# Patient Record
Sex: Female | Born: 1950 | Race: White | Hispanic: No | Marital: Married | Smoking: Current every day smoker
Health system: Southern US, Community
[De-identification: ages and names within clinical notes are randomized; demographics above are authoritative.]

## PROBLEM LIST (undated history)

## (undated) DIAGNOSIS — K5790 Diverticulosis of intestine, part unspecified, without perforation or abscess without bleeding: Secondary | ICD-10-CM

## (undated) HISTORY — DX: Diverticulosis of intestine, part unspecified, without perforation or abscess without bleeding: K57.90

## (undated) HISTORY — PX: OOPHORECTOMY: SHX86

## (undated) HISTORY — PX: BREAST EXCISIONAL BIOPSY: SUR124

---
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
## 1986-04-06 HISTORY — PX: ABDOMINAL HYSTERECTOMY: SHX81

## 2008-12-07 ENCOUNTER — Ambulatory Visit: Payer: Self-pay | Admitting: Family Medicine

## 2010-12-05 ENCOUNTER — Ambulatory Visit: Payer: Self-pay | Admitting: General Surgery

## 2012-04-13 ENCOUNTER — Ambulatory Visit: Payer: Self-pay | Admitting: General Practice

## 2012-06-03 ENCOUNTER — Ambulatory Visit: Payer: Self-pay | Admitting: General Practice

## 2014-07-27 NOTE — Op Note (Signed)
PATIENT NAME:  Julie Pena, BOMAN MR#:  [id number] DATE OF BIRTH:  [birth year]  DATE OF PROCEDURE:  06/03/2012  PREOPERATIVE DIAGNOSIS: Internal derangement of the right knee.   POSTOPERATIVE DIAGNOSES: 1.  Tear of the posterior horn of the medial meniscus, right knee.  2.  Tear of the posterior horn of the lateral meniscus, right knee.  3. 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   PROCEDURES PERFORMED: Right knee arthroscopy, partial medial and lateral meniscectomy, removal of chondral loose body, and chondroplasty of the patellofemoral articulation.   SURGEON: Laurice Record. Hooten, MD  ANESTHESIA: General.   ESTIMATED BLOOD LOSS: Minimal.   TOURNIQUET TIME: Not used.   DRAINS: None.   INDICATIONS FOR SURGERY: The patient is a 64 year old female who has been seen for complaints of persistent right knee pain and swelling. MRI demonstrated findings consistent with meniscal pathology. After discussion of the risks and benefits of surgical intervention, the patient expressed understanding of the risks and benefits and agreed with plans for surgical intervention.   PROCEDURE IN DETAIL: The patient was brought into the operating room and, after adequate general anesthesia was achieved, a tourniquet was placed on the patient's right thigh and the leg was placed in a leg holder. All bony prominences were well padded. The patient's right knee and leg were cleaned and prepped with alcohol and DuraPrep and draped in the usual sterile fashion. A "timeout" was performed as per usual protocol. The anticipated portal sites were injected with 0.25% Marcaine with epinephrine. An anterolateral portal was created and a cannula was inserted. A moderate effusion was evacuated. The scope was inserted and the knee was distended with fluid using the Stryker pump. The scope was advanced down the medial gutter into the medial compartment of the knee. Under visualization  with the scope, an anteromedial portal was created and hook probe was inserted. Inspection of the medial compartment showed the articular surface to be in reasonably good condition with only mild fraying. There was a complex tear of the posterior horn of the medial meniscus with flap-type lesion noted and a horizontal cleavage tear. The area of the tear was debrided using meniscal punches and a 4.5 mm shaver. Final contouring was performed using the 50 degree ArthroCare wand. The remaining rim of meniscus was visualized and probed and felt to stable. The anterior horn of the medial meniscus was visualized and probed and was stable. The scope was advanced into the intercondylar region. The anterior cruciate ligament was visualized and probed and felt to be stable. The scope was removed from the anterolateral portal and reinserted via the anteromedial portal so as to better visualize the lateral compartment. The articular surface was in good condition. There was a small flap-type lesion noted to the posterior horn of the lateral meniscus. This was debrided using meniscal punches and then contoured using the 50 degree ArthroCare wand. The remaining portion of the meniscus was visualized and probed and felt to be stable. There was a chondral loose body which was encountered along the intercondylar notch. This was removed using a pituitary forceps. Inspection of the patellofemoral articulation showed good patellar tracking. There was a chondral lesion with grade III changes noted along the notch and groove. These areas were debrided using the ArthroCare wand. The knee was irrigated with copious amounts of fluid and then suctioned dry. The anterolateral portal was reapproximated using 3-0 nylon. A combination of 0.25% Marcaine with epinephrine and 4 mg of morphine was injected  via the scope. The scope was removed and the anteromedial portal was reapproximated using 3-0 nylon. Sterile dressing was applied followed by  application of an ice wrap.   The patient tolerated the procedure well. She was transported to the recovery room in stable condition.  ____________________________ Laurice Record. Holley Bouche., MD [email or web address] D: 06/03/2012 09:17:39 ET T: 06/03/2012 10:15:03 ET JOB#: [id number]  cc: Jeneen Rinks P. Holley Bouche., MD, <Dictator> Laurice Record Holley Bouche MD ELECTRONICALLY SIGNED 06/04/2012 12:58

## 2016-04-06 HISTORY — PX: CATARACT EXTRACTION: SUR2

## 2017-08-11 ENCOUNTER — Ambulatory Visit (INDEPENDENT_AMBULATORY_CARE_PROVIDER_SITE_OTHER): Payer: Medicare HMO | Admitting: Family Medicine

## 2017-08-11 ENCOUNTER — Encounter: Payer: Self-pay | Admitting: Family Medicine

## 2017-08-11 VITALS — BP 125/64 | HR 78 | Temp 97.8°F | Resp 16 | Ht 66.0 in | Wt 177.0 lb

## 2017-08-11 DIAGNOSIS — Z7689 Persons encountering health services in other specified circumstances: Secondary | ICD-10-CM | POA: Diagnosis not present

## 2017-08-11 DIAGNOSIS — Z8739 Personal history of other diseases of the musculoskeletal system and connective tissue: Secondary | ICD-10-CM

## 2017-08-11 DIAGNOSIS — Z79890 Hormone replacement therapy (postmenopausal): Secondary | ICD-10-CM

## 2017-08-11 DIAGNOSIS — K5790 Diverticulosis of intestine, part unspecified, without perforation or abscess without bleeding: Secondary | ICD-10-CM | POA: Insufficient documentation

## 2017-08-11 DIAGNOSIS — Z1231 Encounter for screening mammogram for malignant neoplasm of breast: Secondary | ICD-10-CM

## 2017-08-11 DIAGNOSIS — K635 Polyp of colon: Secondary | ICD-10-CM | POA: Diagnosis not present

## 2017-08-11 DIAGNOSIS — Z1239 Encounter for other screening for malignant neoplasm of breast: Secondary | ICD-10-CM

## 2017-08-11 NOTE — Patient Instructions (Addendum)
Thank you for coming to the office today.  Most likely some arthritis of low back, this is wear and tear issue, can flare up periodically  Stay active, avoid triggering factors, use proper lifting techniques  May use ice and heat as discussed  Recommend to start taking Tylenol Extra Strength 500mg  tabs - take 1 to 2 tabs per dose (max 1000mg ) every 6-8 hours for pain (take regularly, don't skip a dose for next 7 days), max 24 hour daily dose is 6 tablets or 3000mg . In the future you can repeat the same everyday Tylenol course for 1-2 weeks at a time.  - This is safe to take with anti-inflammatory medicines (Ibuprofen, Advil, Naproxen, Aleve, Meloxicam, Mobic)  For Mammogram screening for breast cancer   [facility] [place], [place] [address] Phone: [phone]  SIGN RELEASE FORM AND TURN IN TO [place]  Call the [facility] below anytime to schedule your own appointment now that order has been placed.  [facility]  Address: [address], [place], [place] [address]  Phone: [phone]  Next visit we will offer Prevnar-13 - pneumonia 1st dose then 1 year later is next dose  I would recommend Shingrix vaccine - 2 doses 2 months apart at pharmacy for medicare coverage - may get some reaction to 2nd dose but overall well tolerated and very effective.   DUE for FASTING BLOOD WORK (no food or drink after midnight before the lab appointment, only water or coffee without cream/sugar on the morning of)  SCHEDULE "Lab Only" visit in the morning at the clinic for lab draw in 1-2 weeks  - Make sure Lab Only appointment is at about 1 week before your next appointment, so that results will be available  For Lab Results, once available within 2-3 days of blood draw, you can can log in to MyChart online to view your results and a brief explanation. Also, we can discuss  results at next follow-up visit.   Please schedule a Follow-up Appointment to: Return in about 2 weeks (around 08/25/2017) for keep apt for physical and labs.  If you have any other questions or concerns, please feel free to call the office or send a message through [place]. You may also schedule an earlier appointment if necessary.  Additionally, you may be receiving a survey about your experience at our office within a few days to 1 week by e-mail or mail. We value your feedback.  Nobie Putnam, DO [place]

## 2017-08-11 NOTE — Progress Notes (Signed)
Subjective:    Patient ID: Julie Pena, female    DOB: 1950-04-12, 67 y.o.   MRN: [id number]  Julie Pena is a 67 y.o. female presenting on 08/11/2017 for Establish Care (back pain)  Previously followed by Benita Stabile MD locally and now she has decided to switch PCP to our office, her husband, Desera Graffeo is also my patient here.  HPI   History of endometriosis / Postmenopausal Estrogen Deficiency S/p partial hysterectomy and then 2nd complete Postmenopausal Estrogen Deficiency She has been on chronic HRT, Premarin, now down to 0.5 one pill every 3 days, trying to wean off in future  History of Back Pain Reports chronic history for years with intermittent mild to moderate flare ups of low back pain, without clear injury or problem, usually improve with rest and heating pad. She does not like to take medicine for it. No prior imaging X-ray, no clear dx of arthritis  Diverticulosis Has history of recurrent episodes of diverculitis - she has occasional flare ups, with either diarrhea and/or constipation, usually starts with deeper pain mid stomach, and nausea, she does not have dark stools or blood associated her prior flare ups. Usually improved with Cipro antibiotic, and occasionally requires additional Flagyl x 1 episode was more severe, usually likes to have it treated early onset.  PMH - as child she had issue of required bladder dilation, now she has had some recurrent episodes of UTI, has inc freq and dysuria, none recently but has episodic spells  Recent stomach virus week ago, sick contacts with husband and grandson at home. She has improved now.  Lifestyle - Diet: mostly balanced - Exercise: Limited. Gym member, daughter working full time   Health Maintenance:  Cervical CA Screening: S/p initial partial hysterectomy then total hysterectomy including removal of cervix. She had pap smear still completed by Dr Luan Pulling, reportedly normal. No prior abnormal screening, now  age >74 without cervix  Breast CA Screening: Due for mammogram screening. Last mammogram result 1 year ago reported negative, 09/07/16 in [place] bi-rads 2, done at [facility]. No prior history abnormal mammogram. No known family history of breast cancer. Currently asymptomatic. - She requests clinical breast exam yearly, she does not often do self exam  Colon CA Screening: Last Colonoscopy (done approx 3-5 years ago by uncertain but done local in [place]), results with multiple benign polyps, good for 10 years per her, do not have copy of report. Currently asymptomatic. No known family history of colon CA. Awaiting record and result to scan. Next due colonoscopy approx 2025-2026  Never had pneumonia - will plan on prevnar-13 1st dose age 39, then year later pneumovax-11  Shingles - never had shingles vaccine, she had new granddaughter at home, now requesting more information on this vaccine interested in Shingrix   Depression screen [facility] 2/9 08/11/2017  Decreased Interest 0  Down, Depressed, Hopeless 0  PHQ - 2 Score 0    Past Medical History:  Diagnosis Date  . Diverticulosis    Past Surgical History:  Procedure Laterality Date  . ABDOMINAL HYSTERECTOMY  1988   Initial partial 1978, then repeat TOTAL 1988  . CATARACT EXTRACTION Bilateral 2018  . TONSILLECTOMY Bilateral 1963   Social History   Socioeconomic History  . Marital status: Married    Spouse name: Mykal Kirchman  . Number of children: Not on file  . Years of education: Not on file  . Highest education level: Not on file  Occupational History  .  Not on file  Social Needs  . Financial resource strain: Not on file  . Food insecurity:    Worry: Not on file    Inability: Not on file  . Transportation needs:    Medical: Not on file    Non-medical: Not on file  Tobacco Use  . Smoking status: Current Every Day Smoker    Packs/day: 0.50    Years: 30.00    Pack years: 15.00    Types: Cigarettes  .  Smokeless tobacco: Current User  Substance and Sexual Activity  . Alcohol use: Never    Frequency: Never  . Drug use: Never  . Sexual activity: Not on file  Lifestyle  . Physical activity:    Days per week: Not on file    Minutes per session: Not on file  . Stress: Not on file  Relationships  . Social connections:    Talks on phone: Not on file    Gets together: Not on file    Attends religious service: Not on file    Active member of club or organization: Not on file    Attends meetings of clubs or organizations: Not on file    Relationship status: Not on file  . Intimate partner violence:    Fear of current or ex partner: Not on file    Emotionally abused: Not on file    Physically abused: Not on file    Forced sexual activity: Not on file  Other Topics Concern  . Not on file  Social History Narrative  . Not on file   Family History  Problem Relation Age of Onset  . Cancer Mother        kidney  . Kidney cancer Mother   . Cancer Father        lung  . Alcohol abuse Father   . Depression Father   . Lung cancer Father   . Colon cancer Neg Hx   . Breast cancer Neg Hx    Current Outpatient Medications on File Prior to Visit  Medication Sig  . estradiol (ESTRACE) 0.5 MG tablet Take 0.5 mg by mouth daily.   . fexofenadine-pseudoephedrine (ALLEGRA-D 24) 180-240 MG 24 hr tablet Take 1 tablet by mouth daily.  . Probiotic Product (ALIGN PO) Take by mouth. Take probiotic daily   No current facility-administered medications on file prior to visit.     Review of Systems  Constitutional: Negative for activity change, appetite change, chills, diaphoresis, fatigue and fever.  HENT: Negative for congestion and hearing loss.   Eyes: Negative for visual disturbance.  Respiratory: Negative for apnea, cough, choking, chest tightness, shortness of breath and wheezing.   Cardiovascular: Negative for chest pain, palpitations and leg swelling.  Gastrointestinal: Negative for abdominal  pain, anal bleeding, blood in stool, constipation, diarrhea, nausea and vomiting.  Endocrine: Negative for cold intolerance.  Genitourinary: Negative for decreased urine volume, difficulty urinating, dysuria, frequency, hematuria, pelvic pain, urgency, vaginal bleeding, vaginal discharge and vaginal pain.  Musculoskeletal: Negative for arthralgias, back pain (none actively, history of recurrent episodes, mild back pain) and neck pain.  Skin: Negative for rash.  Allergic/Immunologic: Negative for environmental allergies.  Neurological: Negative for dizziness, weakness, light-headedness, numbness and headaches.  Hematological: Negative for adenopathy.  Psychiatric/Behavioral: Negative for behavioral problems, dysphoric mood and sleep disturbance. The patient is not nervous/anxious.    Per HPI unless specifically indicated above     Objective:    BP 125/64   Pulse 78   Temp 97.8  F (36.6 C) (Oral)   Resp 16   Ht 5\' 6"  (1.676 m)   Wt 177 lb (80.3 kg)   SpO2 99%   BMI 28.57 kg/m   Wt Readings from Last 3 Encounters:  08/11/17 177 lb (80.3 kg)    Physical Exam  Constitutional: She is oriented to person, place, and time. She appears well-developed and well-nourished. No distress.  Well-appearing, comfortable, cooperative  HENT:  Head: Normocephalic and atraumatic.  Mouth/Throat: Oropharynx is clear and moist.  Eyes: Conjunctivae are normal. Right eye exhibits no discharge. Left eye exhibits no discharge.  Cardiovascular: Normal rate.  Pulmonary/Chest: Effort normal.  Musculoskeletal: She exhibits no edema.  Neurological: She is alert and oriented to person, place, and time.  Skin: Skin is warm and dry. No rash noted. She is not diaphoretic. No erythema.  Psychiatric: She has a normal mood and affect. Her behavior is normal.  Well groomed, good eye contact, normal speech and thoughts  Nursing note and vitals reviewed.  No results found for this or any previous visit.      Assessment & Plan:   Problem List Items Addressed This Visit    Colon polyps    On last colonoscopy by her report Request record and report for review to be abstracted Anticipate next due in approx 5-7 years      Diverticulosis - Primary    Stable without flare up Prior chronic history episodic recurrent flares, improve typically with Cipro +/- Flagyl Follow-up PRN in future, given time sensitivity of issues likely will send rx Cipro to pharmacy at initial onset if mild moderate and both antibiotics if more severe, and arrange close follow-up, may need 2nd opinion GI if too frequent flares      History of back pain    Stable without flare 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 without significant problem or complication Improve w/ conservative therapy as needed for flares, not taking regular NSAID or Tylenol Follow-up in future PRN - may need update imaging, Lumbar spine X-ray      Hormone replacement therapy (postmenopausal)    Stable, currently still on chronic HRT, lower intermittent dose now Follow-up in future as planned, refill as needed and continue to wean off gradually       Other Visit Diagnoses    Screening for breast cancer       Due for yearly mammogram, no prior abnormal, previously at [facility] > release record form signed, to be faxed to [facility], ordered Mammo   Relevant Orders   MM [place]   Encounter to establish care with new doctor       Records requested from Dr Hall Busing previous PCP for review      No orders of the defined types were placed in this encounter.   Follow up plan: Return in about 2 weeks (around 08/25/2017) for keep apt for physical and labs.  Future labs ordered 08/18/17 - will place orders and adjust according to last lab results from outside PCP once record is reviewed  Nobie Putnam, [facility] 08/12/2017, 12:06 AM

## 2017-08-12 ENCOUNTER — Other Ambulatory Visit: Payer: Self-pay | Admitting: Family Medicine

## 2017-08-12 DIAGNOSIS — K5790 Diverticulosis of intestine, part unspecified, without perforation or abscess without bleeding: Secondary | ICD-10-CM

## 2017-08-12 DIAGNOSIS — Z0000 Encounter for general adult medical examination without abnormal findings: Secondary | ICD-10-CM

## 2017-08-12 DIAGNOSIS — Z1159 Encounter for screening for other viral diseases: Secondary | ICD-10-CM

## 2017-08-12 DIAGNOSIS — Z79899 Other long term (current) drug therapy: Secondary | ICD-10-CM

## 2017-08-12 DIAGNOSIS — Z79890 Hormone replacement therapy (postmenopausal): Secondary | ICD-10-CM

## 2017-08-12 NOTE — Assessment & Plan Note (Addendum)
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

## 2017-08-12 NOTE — Assessment & Plan Note (Addendum)
Stable without flare up Prior chronic history episodic recurrent flares, improve typically with Cipro +/- Flagyl Follow-up PRN in future, given time sensitivity of issues likely will send rx Cipro to pharmacy at initial onset if mild moderate and both antibiotics if more severe, and arrange close follow-up, may need 2nd opinion GI if too frequent flares

## 2017-08-12 NOTE — Assessment & Plan Note (Signed)
Stable, currently still on chronic HRT, lower intermittent dose now Follow-up in future as planned, refill as needed and continue to wean off gradually

## 2017-08-12 NOTE — Assessment & Plan Note (Signed)
On last colonoscopy by her report Request record and report for review to be abstracted Anticipate next due in approx 5-7 years

## 2017-08-18 ENCOUNTER — Other Ambulatory Visit: Payer: Medicare HMO

## 2017-08-18 DIAGNOSIS — Z0000 Encounter for general adult medical examination without abnormal findings: Secondary | ICD-10-CM | POA: Diagnosis not present

## 2017-08-18 DIAGNOSIS — K5790 Diverticulosis of intestine, part unspecified, without perforation or abscess without bleeding: Secondary | ICD-10-CM

## 2017-08-18 DIAGNOSIS — Z1159 Encounter for screening for other viral diseases: Secondary | ICD-10-CM

## 2017-08-18 DIAGNOSIS — Z79899 Other long term (current) drug therapy: Secondary | ICD-10-CM | POA: Diagnosis not present

## 2017-08-18 DIAGNOSIS — Z79890 Hormone replacement therapy (postmenopausal): Secondary | ICD-10-CM

## 2017-08-19 LAB — CBC WITH DIFFERENTIAL/PLATELET
Basophils Absolute: 31 {cells}/uL (ref 0–200)
Basophils Relative: 0.6 %
Eosinophils Absolute: 88 cells/uL (ref 15–500)
Eosinophils Relative: 1.7 %
HCT: 37.8 % (ref 35.0–45.0)
Hemoglobin: 13.2 g/dL (ref 11.7–15.5)
Lymphs Abs: 1378 {cells}/uL (ref 850–3900)
MCH: 31 pg (ref 27.0–33.0)
MCHC: 34.9 g/dL (ref 32.0–36.0)
MCV: 88.7 fL (ref 80.0–100.0)
MPV: 10 fL (ref 7.5–12.5)
Monocytes Relative: 5.4 %
Neutro Abs: 3422 cells/uL (ref 1500–7800)
Neutrophils Relative %: 65.8 %
Platelets: 175 10*3/uL (ref 140–400)
RBC: 4.26 10*6/uL (ref 3.80–5.10)
RDW: 13.1 % (ref 11.0–15.0)
Total Lymphocyte: 26.5 %
WBC mixed population: 281 cells/uL (ref 200–950)
WBC: 5.2 10*3/uL (ref 3.8–10.8)

## 2017-08-19 LAB — COMPLETE METABOLIC PANEL WITH GFR
AG Ratio: 2 (calc) (ref 1.0–2.5)
ALT: 14 U/L (ref 6–29)
AST: 15 U/L (ref 10–35)
Albumin: 4.2 g/dL (ref 3.6–5.1)
Alkaline phosphatase (APISO): 73 U/L (ref 33–130)
BUN: 15 mg/dL (ref 7–25)
CO2: 30 mmol/L (ref 20–32)
Calcium: 9.2 mg/dL (ref 8.6–10.4)
Chloride: 107 mmol/L (ref 98–110)
Creat: 0.83 mg/dL (ref 0.50–0.99)
GFR, Est African American: 85 mL/min/{1.73_m2} (ref >=60)
GFR, Est Non African American: 73 mL/min/{1.73_m2} (ref >=60)
Globulin: 2.1 g/dL (calc) (ref 1.9–3.7)
Glucose, Bld: 94 mg/dL (ref 65–99)
Potassium: 4.3 mmol/L (ref 3.5–5.3)
Sodium: 141 mmol/L (ref 135–146)
Total Bilirubin: 0.5 mg/dL (ref 0.2–1.2)
Total Protein: 6.3 g/dL (ref 6.1–8.1)

## 2017-08-19 LAB — HEPATITIS C ANTIBODY
Hepatitis C Ab: NONREACTIVE
SIGNAL TO CUT-OFF: 0.02 (ref <1.00)

## 2017-08-19 LAB — LIPID PANEL
Cholesterol: 182 mg/dL (ref <200)
HDL: 45 mg/dL — ABNORMAL LOW (ref >50)
LDL Cholesterol (Calc): 108 mg/dL (calc) — ABNORMAL HIGH
Non-HDL Cholesterol (Calc): 137 mg/dL — ABNORMAL HIGH (ref <130)
Total CHOL/HDL Ratio: 4 (calc) (ref <5.0)
Triglycerides: 169 mg/dL — ABNORMAL HIGH (ref <150)

## 2017-08-19 LAB — TSH: TSH: 2.14 m[IU]/L (ref 0.40–4.50)

## 2017-08-19 LAB — HEMOGLOBIN A1C
Hgb A1c MFr Bld: 5.4 %{Hb} (ref <5.7)
Mean Plasma Glucose: 108 (calc)
eAG (mmol/L): 6 (calc)

## 2017-08-25 ENCOUNTER — Encounter: Payer: Self-pay | Admitting: Family Medicine

## 2017-08-25 ENCOUNTER — Other Ambulatory Visit: Payer: Self-pay

## 2017-08-25 ENCOUNTER — Ambulatory Visit (INDEPENDENT_AMBULATORY_CARE_PROVIDER_SITE_OTHER): Payer: Medicare HMO | Admitting: Family Medicine

## 2017-08-25 VITALS — BP 135/65 | HR 80 | Temp 98.2°F | Ht 66.0 in | Wt 174.2 lb

## 2017-08-25 DIAGNOSIS — Z79890 Hormone replacement therapy (postmenopausal): Secondary | ICD-10-CM | POA: Diagnosis not present

## 2017-08-25 DIAGNOSIS — Z23 Encounter for immunization: Secondary | ICD-10-CM | POA: Diagnosis not present

## 2017-08-25 DIAGNOSIS — K635 Polyp of colon: Secondary | ICD-10-CM | POA: Diagnosis not present

## 2017-08-25 DIAGNOSIS — Z8739 Personal history of other diseases of the musculoskeletal system and connective tissue: Secondary | ICD-10-CM | POA: Diagnosis not present

## 2017-08-25 DIAGNOSIS — K5790 Diverticulosis of intestine, part unspecified, without perforation or abscess without bleeding: Secondary | ICD-10-CM | POA: Diagnosis not present

## 2017-08-25 DIAGNOSIS — Z0000 Encounter for general adult medical examination without abnormal findings: Secondary | ICD-10-CM | POA: Diagnosis not present

## 2017-08-25 NOTE — Patient Instructions (Addendum)
Thank you for coming to the office today.  Keep mammogram apt - I am not sure about 3d vs other - check with insurance  Will request record for Colonoscopy - and let you know  A1c 5.4 - essentially normal sugar, we will keep track of this yearly  Cholesterol is only mildly abnormal - technically as a smoker may benefit from cholesterol medicine  Tetanus TDap shot today - good for 10 years  Call in advance for Prevnar-13 (1st pneumonia vaccine) - NURSE VISIT when you are ready.  May try Dermabond liquid bandage at pharmacy  Consider DEXA Scan (Bone mineral density) screening for osteoporosis - should be covered  Laceration try to keep covered, wound approximated and will heal   Please schedule a Follow-up Appointment to: Return in about 6 months (around 02/25/2018) for 6 month follow-up.  If you have any other questions or concerns, please feel free to call the office or send a message through [place]. You may also schedule an earlier appointment if necessary.  Additionally, you may be receiving a survey about your experience at our office within a few days to 1 week by e-mail or mail. We value your feedback.  Nobie Putnam, DO [place]

## 2017-08-25 NOTE — Progress Notes (Signed)
Subjective:    Patient ID: Julie Pena, female    DOB: Feb 25, 1951, 67 y.o.   MRN: [id number]  Julie Pena is a 67 y.o. female presenting on 08/25/2017 for Annual Exam   HPI   Here for Annual Physical and Lab Review.  History of endometriosis / Postmenopausal Estrogen Deficiency S/p partial hysterectomy and then 2nd complete Postmenopausal Estrogen Deficiency She has been on chronic HRT, Premarin, now down to 0.5 one pill every 3 days, trying to wean off in future she gets a mild headache on day 3 if doesn't take med  History of Back Pain Reports chronic history for years with intermittent mild to moderate flare ups of low back pain, without clear injury or problem, usually improve with rest and heating pad. She does not like to take medicine for it. No prior imaging X-ray, no clear dx of arthritis  Diverticulosis Has history of recurrent episodes of diverculitis - she has occasional flare ups, with either diarrhea and/or constipation, usually starts with deeper pain mid stomach, and nausea, she does not have dark stools or blood associated her prior flare ups. Usually improved with Cipro antibiotic, and occasionally requires additional Flagyl x 1 episode was more severe, usually likes to have it treated early onset.  History of recurrent UTI Currently asymptomatic. History previuosly of recurrent UTI, req bladder dilatation as child, history of dysuria and urinary freq as symptoms, usually requires antibiotic in past Cipro, Ceftin or other, uses OTC AZO temporarily before   Lifestyle - Diet: mostly balanced - Exercise: Limited. Gym member, daughter working full time   Health Maintenance:  Cervical CA Screening: S/p initial partial hysterectomy then total hysterectomy including removal of cervix. She had pap smear still completed by Dr Luan Pulling, reportedly normal. No prior abnormal screening, now age >72 without cervix  Breast CA Screening: Due for mammogram screening.  Last mammogram result 1 year ago reported negative, 09/07/16 in [place] bi-rads 2, done at [facility]. No prior history abnormal mammogram. No known family history of breast cancer. Currently asymptomatic. - She requests clinical breast exam but then declines today based on upcoming mammogram, she does not often do self exam Scheduled Mammogram, 09/14/17 at [facility]  Colon CA Screening: Last Colonoscopy (done approx 3-5 years ago by uncertain but done local in [place]), results with multiple benign polyps, good for 10 years per her, do not have copy of report. Currently asymptomatic. No known family history of colon CA. Awaiting record and result to scan. Next due colonoscopy approx 2022 based on outside PCP records reviewed  Due for initial pneumonia vaccine at age >69 - she deferred Prevnar-13 and will return for nurse only visit for this vaccine then next year for pneumovax-23  Shingles - never had shingles vaccine, she had new granddaughter at home, now requesting more information on this vaccine interested in Shingrix  Due for TDap, in setting of laceration of her Left ring finger, she had injury in kitchen with scissors, has some bleeding, but doing well.   Depression screen PHQ 2/9 08/11/2017  Decreased Interest 0  Down, Depressed, Hopeless 0  PHQ - 2 Score 0    Past Medical History:  Diagnosis Date  . Diverticulosis    Past Surgical History:  Procedure Laterality Date  . ABDOMINAL HYSTERECTOMY  1988   Initial partial 1978, then repeat TOTAL 1988  . CATARACT EXTRACTION Bilateral 2018  . TONSILLECTOMY Bilateral 1963   Social History   Socioeconomic History  . Marital status:  Married    Spouse name: Ailish Prospero  . Number of children: Not on file  . Years of education: Not on file  . Highest education level: Not on file  Occupational History  . Not on file  Social Needs  . Financial resource strain: Not on file  . Food insecurity:    Worry:  Not on file    Inability: Not on file  . Transportation needs:    Medical: Not on file    Non-medical: Not on file  Tobacco Use  . Smoking status: Current Every Day Smoker    Packs/day: 0.50    Years: 30.00    Pack years: 15.00    Types: Cigarettes  . Smokeless tobacco: Current User  Substance and Sexual Activity  . Alcohol use: Never    Frequency: Never  . Drug use: Never  . Sexual activity: Not on file  Lifestyle  . Physical activity:    Days per week: Not on file    Minutes per session: Not on file  . Stress: Not on file  Relationships  . Social connections:    Talks on phone: Not on file    Gets together: Not on file    Attends religious service: Not on file    Active member of club or organization: Not on file    Attends meetings of clubs or organizations: Not on file    Relationship status: Not on file  . Intimate partner violence:    Fear of current or ex partner: Not on file    Emotionally abused: Not on file    Physically abused: Not on file    Forced sexual activity: Not on file  Other Topics Concern  . Not on file  Social History Narrative  . Not on file   Family History  Problem Relation Age of Onset  . Cancer Mother        kidney  . Kidney cancer Mother   . Cancer Father        lung  . Alcohol abuse Father   . Depression Father   . Lung cancer Father   . Colon cancer Neg Hx   . Breast cancer Neg Hx    Current Outpatient Medications on File Prior to Visit  Medication Sig  . estradiol (ESTRACE) 0.5 MG tablet Take 0.5 mg by mouth daily.   . fexofenadine-pseudoephedrine (ALLEGRA-D 24) 180-240 MG 24 hr tablet Take 1 tablet by mouth daily.  . Probiotic Product (ALIGN PO) Take by mouth. Take probiotic daily   No current facility-administered medications on file prior to visit.     Review of Systems  Constitutional: Negative for activity change, appetite change, chills, diaphoresis, fatigue and fever.  HENT: Negative for congestion and hearing  loss.   Eyes: Negative for visual disturbance.  Respiratory: Negative for apnea, cough, choking, chest tightness, shortness of breath and wheezing.   Cardiovascular: Negative for chest pain, palpitations and leg swelling.  Gastrointestinal: Negative for abdominal pain, anal bleeding, blood in stool, constipation, diarrhea, nausea and vomiting.  Endocrine: Negative for cold intolerance.  Genitourinary: Negative for difficulty urinating, dysuria, frequency, hematuria, vaginal bleeding, vaginal discharge and vaginal pain.  Musculoskeletal: Negative for arthralgias, back pain and neck pain.  Skin: Negative for rash.  Allergic/Immunologic: Negative for environmental allergies.  Neurological: Negative for dizziness, weakness, light-headedness, numbness and headaches.  Hematological: Negative for adenopathy.  Psychiatric/Behavioral: Negative for behavioral problems, dysphoric mood and sleep disturbance. The patient is not nervous/anxious.    Per HPI  unless specifically indicated above     Objective:    BP 135/65 (BP Location: Left Arm, Patient Position: Sitting, Cuff Size: Normal)   Pulse 80   Temp 98.2 F (36.8 C) (Oral)   Ht 5\' 6"  (1.676 m)   Wt 174 lb 3.2 oz (79 kg)   BMI 28.12 kg/m   Wt Readings from Last 3 Encounters:  08/25/17 174 lb 3.2 oz (79 kg)  08/11/17 177 lb (80.3 kg)    Physical Exam  Constitutional: She is oriented to person, place, and time. She appears well-developed and well-nourished. No distress.  Well-appearing, comfortable, cooperative  HENT:  Head: Normocephalic and atraumatic.  Mouth/Throat: Oropharynx is clear and moist.  Frontal / maxillary sinuses non-tender. Nares patent without purulence or edema. Bilateral TMs clear without erythema, effusion or bulging. Oropharynx clear without erythema, exudates, edema or asymmetry.  Eyes: Pupils are equal, round, and reactive to light. Conjunctivae and EOM are normal. Right eye exhibits no discharge. Left eye exhibits  no discharge.  Neck: Normal range of motion. Neck supple. No thyromegaly present.  Cardiovascular: Normal rate, regular rhythm, normal heart sounds and intact distal pulses.  No murmur heard. Pulmonary/Chest: Effort normal and breath sounds normal. No respiratory distress. She has no wheezes. She has no rales.  Abdominal: Soft. Bowel sounds are normal. She exhibits no distension and no mass. There is no tenderness. A hernia (mild palpable lower ventral wall hernia on provoked valsalva, reducible, non tender) is present.  Musculoskeletal: Normal range of motion. She exhibits no edema or tenderness.  Upper / Lower Extremities: - Normal muscle tone, strength bilateral upper extremities 5/5, lower extremities 5/5  Lymphadenopathy:    She has no cervical adenopathy.  Neurological: She is alert and oriented to person, place, and time.  Distal sensation intact to light touch all extremities  Skin: Skin is warm and dry. No rash noted. She is not diaphoretic. No erythema.  Localized subcutaneous mobile mass left shoulder region antero-lateral 2 x 2 cm area, non tender, no erythema  Left ring finger distal pulp of finger with 1 to 1.5 cm linear laceration with some mild bleeding, antibacterial ointment placed, wrapped in gauze and tape to keep intact  Psychiatric: She has a normal mood and affect. Her behavior is normal.  Well groomed, good eye contact, normal speech and thoughts  Nursing note and vitals reviewed.    Results for orders placed or performed in visit on 08/18/17  Hepatitis C antibody  Result Value Ref Range   Hepatitis C Ab NON-REACTIVE NON-REACTI   SIGNAL TO CUT-OFF 0.02 <1.00  TSH  Result Value Ref Range   TSH 2.14 0.40 - 4.50 mIU/L  Lipid panel  Result Value Ref Range   Cholesterol 182 <200 mg/dL   HDL 45 (L) >50 mg/dL   Triglycerides 169 (H) <150 mg/dL   LDL Cholesterol (Calc) 108 (H) mg/dL (calc)   Total CHOL/HDL Ratio 4.0 <5.0 (calc)   Non-HDL Cholesterol (Calc) 137  (H) <130 mg/dL (calc)  COMPLETE METABOLIC PANEL WITH GFR  Result Value Ref Range   Glucose, Bld 94 65 - 99 mg/dL   BUN 15 7 - 25 mg/dL   Creat 0.83 0.50 - 0.99 mg/dL   GFR, Est Non African American 73 > OR = 60 mL/min/1.42m2   GFR, Est African American 85 > OR = 60 mL/min/1.25m2   BUN/Creatinine Ratio NOT APPLICABLE 6 - 22 (calc)   Sodium 141 135 - 146 mmol/L   Potassium 4.3 3.5 - 5.3 mmol/L  Chloride 107 98 - 110 mmol/L   CO2 30 20 - 32 mmol/L   Calcium 9.2 8.6 - 10.4 mg/dL   Total Protein 6.3 6.1 - 8.1 g/dL   Albumin 4.2 3.6 - 5.1 g/dL   Globulin 2.1 1.9 - 3.7 g/dL (calc)   AG Ratio 2.0 1.0 - 2.5 (calc)   Total Bilirubin 0.5 0.2 - 1.2 mg/dL   Alkaline phosphatase (APISO) 73 33 - 130 U/L   AST 15 10 - 35 U/L   ALT 14 6 - 29 U/L  CBC with Differential/Platelet  Result Value Ref Range   WBC 5.2 3.8 - 10.8 Thousand/uL   RBC 4.26 3.80 - 5.10 Million/uL   Hemoglobin 13.2 11.7 - 15.5 g/dL   HCT 37.8 35.0 - 45.0 %   MCV 88.7 80.0 - 100.0 fL   MCH 31.0 27.0 - 33.0 pg   MCHC 34.9 32.0 - 36.0 g/dL   RDW 13.1 11.0 - 15.0 %   Platelets 175 140 - 400 Thousand/uL   MPV 10.0 7.5 - 12.5 fL   Neutro Abs 3,422 1,500 - 7,800 cells/uL   Lymphs Abs 1,378 850 - 3,900 cells/uL   WBC mixed population 281 200 - 950 cells/uL   Eosinophils Absolute 88 15 - 500 cells/uL   Basophils Absolute 31 0 - 200 cells/uL   Neutrophils Relative % 65.8 %   Total Lymphocyte 26.5 %   Monocytes Relative 5.4 %   Eosinophils Relative 1.7 %   Basophils Relative 0.6 %  Hemoglobin A1c  Result Value Ref Range   Hgb A1c MFr Bld 5.4 <5.7 % of total Hgb   Mean Plasma Glucose 108 (calc)   eAG (mmol/L) 6.0 (calc)      Assessment & Plan:   Problem List Items Addressed This Visit    Colon polyps Will contact [facility] and [facility] to determine which office performed last colonoscopy, and request result to have it scanned into system for health maintenance, by PCP outside record it says next due 2022      Diverticulosis Stable without flare See prior A&P    History of back pain   Hormone replacement therapy (postmenopausal) Reviewed gradual taper down on Estradiol now q 3 days consider further spacing out Recommend in future if concerns or still needing long term may refer to GYN for 2nd opinion Reviewed risks    Other Visit Diagnoses    Annual physical exam    -  Primary Updated health maintenance - scheduled mammogram, offered DEXA for routine screening reviewed risk factors and benefits she declines at the moment will reconsider, s/p TDap today due to laceration, deferred Prevnar-13 to a nurse only visit soon - Reviewed lab results - Encourage maintain healthy weight, diet and exercise    Need for diphtheria-tetanus-pertussis (Tdap) vaccine       Relevant Orders   Tdap vaccine greater than or equal to 7yo IM (Completed)      No orders of the defined types were placed in this encounter.   Follow up plan: Return in about 6 months (around 02/25/2018) for 6 month follow-up.  Nobie Putnam, [facility] 08/26/2017, 12:45 AM

## 2017-08-31 ENCOUNTER — Encounter: Payer: Self-pay | Admitting: Family Medicine

## 2017-08-31 ENCOUNTER — Ambulatory Visit (INDEPENDENT_AMBULATORY_CARE_PROVIDER_SITE_OTHER): Payer: Medicare HMO | Admitting: Family Medicine

## 2017-08-31 VITALS — BP 120/70 | HR 78 | Temp 98.7°F | Resp 14 | Ht 66.0 in | Wt 177.6 lb

## 2017-08-31 DIAGNOSIS — R1032 Left lower quadrant pain: Secondary | ICD-10-CM | POA: Diagnosis not present

## 2017-08-31 DIAGNOSIS — K5792 Diverticulitis of intestine, part unspecified, without perforation or abscess without bleeding: Secondary | ICD-10-CM | POA: Diagnosis not present

## 2017-08-31 MED ORDER — CIPROFLOXACIN HCL 500 MG PO TABS: 500.0000 mg | ORAL_TABLET | Freq: Two times a day (BID) | ORAL | 0 refills | Status: DC

## 2017-08-31 NOTE — Progress Notes (Signed)
Subjective:    Patient ID: Julie Pena, female    DOB: 11/02/1950, 67 y.o.   MRN: [id number]  Julie Pena is a 67 y.o. female presenting on 08/31/2017 for Abdominal Pain (for 4 days) and Abdominal Cramping  Patient presents for a same day appointment.  HPI   ACUTE LLQ ABDOMINAL PAIN / PRESUMED DIVERTICULITIS - See past notes for reported prior history of similar diverticulitis flare ups Reports symptoms started over weekend with some twinge and gas pains, and she felt some radiating pain with "knife like stabbing pain" across from lower mid abdomen across to Left side of abdomen. She is taking Advil as needed and using topical heating pad as needed some relief. She can feel a "gas bubble" and "bloating" in this area and it causes some pain. - Previously had more frequent bowel movements as a common symptom BEFORE flare for her - Now last BM was Saturday, has gone 2-3 days without regular bowel movement - She usually gets more constipated bowel movements after flare up - Admits mild nausea, without vomiting - Admits some urinary frequency but this is related to her flare up of diverticulitis similarly in past - Denies any fevers chills sweats, rectal bleeding or blood per stool, hematuria, dysuria   Depression screen [facility] 2/9 08/11/2017  Decreased Interest 0  Down, Depressed, Hopeless 0  PHQ - 2 Score 0    Social History   Tobacco Use  . Smoking status: Current Every Day Smoker    Packs/day: 0.50    Years: 30.00    Pack years: 15.00    Types: Cigarettes  . Smokeless tobacco: Current User  Substance Use Topics  . Alcohol use: Never    Frequency: Never  . Drug use: Never    Review of Systems Per HPI unless specifically indicated above     Objective:    BP 120/70 (BP Location: Right Arm, Patient Position: Sitting, Cuff Size: Normal)   Pulse 78   Temp 98.7 F (37.1 C) (Oral)   Resp 14   Ht 5\' 6"  (1.676 m)   Wt 177 lb 9.6 oz (80.6 kg)   BMI 28.67 kg/m   Wt  Readings from Last 3 Encounters:  08/31/17 177 lb 9.6 oz (80.6 kg)  08/25/17 174 lb 3.2 oz (79 kg)  08/11/17 177 lb (80.3 kg)    Physical Exam  Constitutional: She is oriented to person, place, and time. She appears well-developed and well-nourished. No distress.  Mostly well appearing, some discomfort with abdominal pain, cooperative  HENT:  Head: Normocephalic and atraumatic.  Mouth/Throat: Oropharynx is clear and moist.  Eyes: Conjunctivae are normal. Right eye exhibits no discharge. Left eye exhibits no discharge.  Neck: Normal range of motion. Neck supple. No thyromegaly present.  Cardiovascular: Normal rate, regular rhythm, normal heart sounds and intact distal pulses.  No murmur heard. Pulmonary/Chest: Effort normal and breath sounds normal. No respiratory distress. She has no wheezes. She has no rales.  Abdominal: Soft. Normal appearance. She exhibits no distension and no mass. Bowel sounds are increased. There is no hepatosplenomegaly. There is tenderness in the suprapubic area and left lower quadrant. There is no rigidity, no rebound, no guarding, no CVA tenderness, no tenderness at McBurney's point and negative Murphy's sign.  Musculoskeletal: Normal range of motion. She exhibits no edema.  Lymphadenopathy:    She has no cervical adenopathy.  Neurological: She is alert and oriented to person, place, and time.  Skin: Skin is warm and dry. No  rash noted. She is not diaphoretic. No erythema.  Psychiatric: She has a normal mood and affect. Her behavior is normal.  Well groomed, good eye contact, normal speech and thoughts  Nursing note and vitals reviewed.  Results for orders placed or performed in visit on 08/18/17  Hepatitis C antibody  Result Value Ref Range   Hepatitis C Ab NON-REACTIVE NON-REACTI   SIGNAL TO CUT-OFF 0.02 <1.00  TSH  Result Value Ref Range   TSH 2.14 0.40 - 4.50 mIU/L  Lipid panel  Result Value Ref Range   Cholesterol 182 <200 mg/dL   HDL 45 (L) >50  mg/dL   Triglycerides 169 (H) <150 mg/dL   LDL Cholesterol (Calc) 108 (H) mg/dL (calc)   Total CHOL/HDL Ratio 4.0 <5.0 (calc)   Non-HDL Cholesterol (Calc) 137 (H) <130 mg/dL (calc)  COMPLETE METABOLIC PANEL WITH GFR  Result Value Ref Range   Glucose, Bld 94 65 - 99 mg/dL   BUN 15 7 - 25 mg/dL   Creat 0.83 0.50 - 0.99 mg/dL   GFR, Est Non African American 73 > OR = 60 mL/min/1.8m2   GFR, Est African American 85 > OR = 60 mL/min/1.18m2   BUN/Creatinine Ratio NOT APPLICABLE 6 - 22 (calc)   Sodium 141 135 - 146 mmol/L   Potassium 4.3 3.5 - 5.3 mmol/L   Chloride 107 98 - 110 mmol/L   CO2 30 20 - 32 mmol/L   Calcium 9.2 8.6 - 10.4 mg/dL   Total Protein 6.3 6.1 - 8.1 g/dL   Albumin 4.2 3.6 - 5.1 g/dL   Globulin 2.1 1.9 - 3.7 g/dL (calc)   AG Ratio 2.0 1.0 - 2.5 (calc)   Total Bilirubin 0.5 0.2 - 1.2 mg/dL   Alkaline phosphatase (APISO) 73 33 - 130 U/L   AST 15 10 - 35 U/L   ALT 14 6 - 29 U/L  CBC with Differential/Platelet  Result Value Ref Range   WBC 5.2 3.8 - 10.8 Thousand/uL   RBC 4.26 3.80 - 5.10 Million/uL   Hemoglobin 13.2 11.7 - 15.5 g/dL   HCT 37.8 35.0 - 45.0 %   MCV 88.7 80.0 - 100.0 fL   MCH 31.0 27.0 - 33.0 pg   MCHC 34.9 32.0 - 36.0 g/dL   RDW 13.1 11.0 - 15.0 %   Platelets 175 140 - 400 Thousand/uL   MPV 10.0 7.5 - 12.5 fL   Neutro Abs 3,422 1,500 - 7,800 cells/uL   Lymphs Abs 1,378 850 - 3,900 cells/uL   WBC mixed population 281 200 - 950 cells/uL   Eosinophils Absolute 88 15 - 500 cells/uL   Basophils Absolute 31 0 - 200 cells/uL   Neutrophils Relative % 65.8 %   Total Lymphocyte 26.5 %   Monocytes Relative 5.4 %   Eosinophils Relative 1.7 %   Basophils Relative 0.6 %  Hemoglobin A1c  Result Value Ref Range   Hgb A1c MFr Bld 5.4 <5.7 % of total Hgb   Mean Plasma Glucose 108 (calc)   eAG (mmol/L) 6.0 (calc)      Assessment & Plan:   Problem List Items Addressed This Visit    None    Visit Diagnoses    Diverticulitis    -  Primary   Relevant  Medications   ciprofloxacin (CIPRO) 500 MG tablet   Intermittent left lower quadrant abdominal pain          Clinically consistent with acute LLQ abdominal pain and some nausea vomiting over past few  days, hyperactive bowels without rectal bleeding or dark stools, history is suggestive of similar prior acute diverticulitis flares per patient. She is hemodynamically stable and tolerating PO. Abdomen is mostly benign on exam, some pain but tolerates exam.  Plan Start empiric treatment with antibiotics - Cipro 500mg  BID x 7 days - advised low threshold to add 2nd antibiotic Metronidazole (Flagyl) 500mg  TID x 7 days if not improved significantly or any worsening within 24-48 hours by Thurs this week. - Offered Zofran ODT PRN nausea, she declined - Keep improving hydration - Follow-up within 1 week as needed Strict return criteria given when to go to hospital if not improving  Meds ordered this encounter  Medications  . ciprofloxacin (CIPRO) 500 MG tablet    Sig: Take 1 tablet (500 mg total) by mouth 2 (two) times daily. For 7 days    Dispense:  14 tablet    Refill:  0    Follow up plan: Return in about 1 week (around 09/07/2017), or if symptoms worsen or fail to improve, for diverticulitis.  Nobie Putnam, [facility] 08/31/2017, 8:44 PM

## 2017-08-31 NOTE — Patient Instructions (Addendum)
Thank you for coming to the office today.  Most likely diverticulitis as discussed  Start Cipro antibiotic twice daily for 7 days - if by 48 hours or Thursday not feeling improved enough, call office and we can add on Metronidazole (Flagyl) as well for 7 days  If any time pain is worsening not improving to medicine, or get fevers chills sweats, nausea vomiting diarrhea or significant rectal bleeding and worse, then may need to go to hospital ED for further evaluation.  Please schedule a Follow-up Appointment to: Return in about 1 week (around 09/07/2017), or if symptoms worsen or fail to improve, for diverticulitis.  If you have any other questions or concerns, please feel free to call the office or send a message through [place]. You may also schedule an earlier appointment if necessary.  Additionally, you may be receiving a survey about your experience at our office within a few days to 1 week by e-mail or mail. We value your feedback.  Nobie Putnam, DO [place]

## 2017-09-14 ENCOUNTER — Ambulatory Visit
Admission: RE | Admit: 2017-09-14 | Discharge: 2017-09-14 | Disposition: A | Discharge status: Home / Self Care | Payer: Medicare HMO | Source: Ambulatory Visit | Attending: Family Medicine | Admitting: Family Medicine

## 2017-09-14 DIAGNOSIS — Z1231 Encounter for screening mammogram for malignant neoplasm of breast: Secondary | ICD-10-CM | POA: Diagnosis not present

## 2017-09-14 DIAGNOSIS — Z1239 Encounter for other screening for malignant neoplasm of breast: Secondary | ICD-10-CM

## 2017-11-10 DIAGNOSIS — M5431 Sciatica, right side: Secondary | ICD-10-CM | POA: Diagnosis not present

## 2017-11-10 DIAGNOSIS — M5136 Other intervertebral disc degeneration, lumbar region: Secondary | ICD-10-CM | POA: Diagnosis not present

## 2017-11-10 DIAGNOSIS — M9903 Segmental and somatic dysfunction of lumbar region: Secondary | ICD-10-CM | POA: Diagnosis not present

## 2017-11-10 DIAGNOSIS — M9905 Segmental and somatic dysfunction of pelvic region: Secondary | ICD-10-CM | POA: Diagnosis not present

## 2017-11-12 DIAGNOSIS — M5136 Other intervertebral disc degeneration, lumbar region: Secondary | ICD-10-CM | POA: Diagnosis not present

## 2017-11-12 DIAGNOSIS — M5431 Sciatica, right side: Secondary | ICD-10-CM | POA: Diagnosis not present

## 2017-11-12 DIAGNOSIS — M9903 Segmental and somatic dysfunction of lumbar region: Secondary | ICD-10-CM | POA: Diagnosis not present

## 2017-11-12 DIAGNOSIS — M9905 Segmental and somatic dysfunction of pelvic region: Secondary | ICD-10-CM | POA: Diagnosis not present

## 2017-11-15 DIAGNOSIS — M5431 Sciatica, right side: Secondary | ICD-10-CM | POA: Diagnosis not present

## 2017-11-15 DIAGNOSIS — M9903 Segmental and somatic dysfunction of lumbar region: Secondary | ICD-10-CM | POA: Diagnosis not present

## 2017-11-15 DIAGNOSIS — M9905 Segmental and somatic dysfunction of pelvic region: Secondary | ICD-10-CM | POA: Diagnosis not present

## 2017-11-15 DIAGNOSIS — M5136 Other intervertebral disc degeneration, lumbar region: Secondary | ICD-10-CM | POA: Diagnosis not present

## 2017-11-17 DIAGNOSIS — M5431 Sciatica, right side: Secondary | ICD-10-CM | POA: Diagnosis not present

## 2017-11-17 DIAGNOSIS — M9903 Segmental and somatic dysfunction of lumbar region: Secondary | ICD-10-CM | POA: Diagnosis not present

## 2017-11-17 DIAGNOSIS — M9905 Segmental and somatic dysfunction of pelvic region: Secondary | ICD-10-CM | POA: Diagnosis not present

## 2017-11-17 DIAGNOSIS — M5136 Other intervertebral disc degeneration, lumbar region: Secondary | ICD-10-CM | POA: Diagnosis not present

## 2017-11-19 DIAGNOSIS — M9903 Segmental and somatic dysfunction of lumbar region: Secondary | ICD-10-CM | POA: Diagnosis not present

## 2017-11-19 DIAGNOSIS — M9905 Segmental and somatic dysfunction of pelvic region: Secondary | ICD-10-CM | POA: Diagnosis not present

## 2017-11-19 DIAGNOSIS — M5136 Other intervertebral disc degeneration, lumbar region: Secondary | ICD-10-CM | POA: Diagnosis not present

## 2017-11-19 DIAGNOSIS — M5431 Sciatica, right side: Secondary | ICD-10-CM | POA: Diagnosis not present

## 2017-11-22 DIAGNOSIS — M9903 Segmental and somatic dysfunction of lumbar region: Secondary | ICD-10-CM | POA: Diagnosis not present

## 2017-11-22 DIAGNOSIS — M5136 Other intervertebral disc degeneration, lumbar region: Secondary | ICD-10-CM | POA: Diagnosis not present

## 2017-11-22 DIAGNOSIS — M5431 Sciatica, right side: Secondary | ICD-10-CM | POA: Diagnosis not present

## 2017-11-22 DIAGNOSIS — M9905 Segmental and somatic dysfunction of pelvic region: Secondary | ICD-10-CM | POA: Diagnosis not present

## 2017-11-26 DIAGNOSIS — M5136 Other intervertebral disc degeneration, lumbar region: Secondary | ICD-10-CM | POA: Diagnosis not present

## 2017-11-26 DIAGNOSIS — M9903 Segmental and somatic dysfunction of lumbar region: Secondary | ICD-10-CM | POA: Diagnosis not present

## 2017-11-26 DIAGNOSIS — M5431 Sciatica, right side: Secondary | ICD-10-CM | POA: Diagnosis not present

## 2017-11-26 DIAGNOSIS — M9905 Segmental and somatic dysfunction of pelvic region: Secondary | ICD-10-CM | POA: Diagnosis not present

## 2017-11-30 DIAGNOSIS — M5136 Other intervertebral disc degeneration, lumbar region: Secondary | ICD-10-CM | POA: Diagnosis not present

## 2017-11-30 DIAGNOSIS — M9905 Segmental and somatic dysfunction of pelvic region: Secondary | ICD-10-CM | POA: Diagnosis not present

## 2017-11-30 DIAGNOSIS — M9903 Segmental and somatic dysfunction of lumbar region: Secondary | ICD-10-CM | POA: Diagnosis not present

## 2017-11-30 DIAGNOSIS — M5431 Sciatica, right side: Secondary | ICD-10-CM | POA: Diagnosis not present

## 2017-12-01 DIAGNOSIS — M9903 Segmental and somatic dysfunction of lumbar region: Secondary | ICD-10-CM | POA: Diagnosis not present

## 2017-12-01 DIAGNOSIS — M5431 Sciatica, right side: Secondary | ICD-10-CM | POA: Diagnosis not present

## 2017-12-01 DIAGNOSIS — M9905 Segmental and somatic dysfunction of pelvic region: Secondary | ICD-10-CM | POA: Diagnosis not present

## 2017-12-01 DIAGNOSIS — M5136 Other intervertebral disc degeneration, lumbar region: Secondary | ICD-10-CM | POA: Diagnosis not present

## 2017-12-03 DIAGNOSIS — M5431 Sciatica, right side: Secondary | ICD-10-CM | POA: Diagnosis not present

## 2017-12-03 DIAGNOSIS — M9905 Segmental and somatic dysfunction of pelvic region: Secondary | ICD-10-CM | POA: Diagnosis not present

## 2017-12-03 DIAGNOSIS — M5136 Other intervertebral disc degeneration, lumbar region: Secondary | ICD-10-CM | POA: Diagnosis not present

## 2017-12-03 DIAGNOSIS — M9903 Segmental and somatic dysfunction of lumbar region: Secondary | ICD-10-CM | POA: Diagnosis not present

## 2017-12-10 ENCOUNTER — Ambulatory Visit (INDEPENDENT_AMBULATORY_CARE_PROVIDER_SITE_OTHER): Payer: Medicare HMO | Admitting: Family Medicine

## 2017-12-10 ENCOUNTER — Encounter: Payer: Self-pay | Admitting: Family Medicine

## 2017-12-10 VITALS — BP 130/61 | HR 89 | Temp 98.4°F | Resp 16 | Ht 68.0 in | Wt 178.0 lb

## 2017-12-10 DIAGNOSIS — J0100 Acute maxillary sinusitis, unspecified: Secondary | ICD-10-CM | POA: Diagnosis not present

## 2017-12-10 DIAGNOSIS — S46811A Strain of other muscles, fascia and tendons at shoulder and upper arm level, right arm, initial encounter: Secondary | ICD-10-CM

## 2017-12-10 DIAGNOSIS — N3001 Acute cystitis with hematuria: Secondary | ICD-10-CM

## 2017-12-10 DIAGNOSIS — M5441 Lumbago with sciatica, right side: Secondary | ICD-10-CM

## 2017-12-10 DIAGNOSIS — N390 Urinary tract infection, site not specified: Secondary | ICD-10-CM | POA: Diagnosis not present

## 2017-12-10 DIAGNOSIS — R319 Hematuria, unspecified: Secondary | ICD-10-CM | POA: Diagnosis not present

## 2017-12-10 LAB — POCT URINALYSIS DIPSTICK
Bilirubin, UA: NEGATIVE
Glucose, UA: NEGATIVE
Ketones, UA: NEGATIVE
Nitrite, UA: POSITIVE
Protein, UA: NEGATIVE
Spec Grav, UA: 1.01 (ref 1.010–1.025)
Urobilinogen, UA: 0.2 U/dL
pH, UA: 5 (ref 5.0–8.0)

## 2017-12-10 MED ORDER — IPRATROPIUM BROMIDE 0.06 % NA SOLN: 2.0000 | Freq: Four times a day (QID) | NASAL | 0 refills | Status: DC

## 2017-12-10 MED ORDER — AMOXICILLIN-POT CLAVULANATE 875-125 MG PO TABS: 1.0000 | ORAL_TABLET | Freq: Two times a day (BID) | ORAL | 0 refills | Status: DC

## 2017-12-10 NOTE — Progress Notes (Signed)
Subjective:    Patient ID: Julie Pena, female    DOB: 1951/01/29, 67 y.o.   MRN: [id number]  CYNDY BRAVER is a 67 y.o. female presenting on 12/10/2017 for Urinary Tract Infection (Hx of  neck pain and sciatica went to see chriopractor doesn't know if sinus pressure is from neck pain onset 3-4 weeks also feel like UTI sx with foul smell onset couple of weeks) and Fever (low grade last week)   HPI   Sinusitis, Acute - Reports symptoms started about 1 week ago, with sinus pressure and congestion, worse symptoms over past weekend, developed low grade temp up to 100.14F would break temp with advil PRN, felt sinus pain and pressure deeper central and even radiating to teeth, also with headache and some neck pain. Admits ear fullness and pressure. - Taking Loratadine daily - She is going on a trip out of state to [place] week after next Admits headache Denies cough, chills, sweats  UTI Reports symptoms started about 3 weeks ago with urinary burning and dysuria and incomplete emptying sensation and feeling urinary frequency, she drank a lot of water and cranberry juice, and she felt much better but then it still seemed to linger at times and would return now less often but still has some more worsening episodes of dysuria and urinary frequency. - Admits urine cloudy with odor - Denies nausea vomiting, flank pain, hematuria, abdominal pain, diarrhea  Neck Pain / Back Pain Reports about 1 month ago she noticed some neck pain upon waking up thought slept funny or more positional. She also developed some R sciatica pain. She went to Chiropractor Dr Francisca December locally, and treated her R hip/back and her neck and she got some initial relief but developed significant soreness. Her sciatica is much improved now, very rarely feels a twinge only but now it is much improved. She had X-rays Lumbar spine with arthritis and some mild scoliosis. - Admits neck muscle spasms, using biofreeze topical, she feels muscle  knot - Denies numbness weakness tingling  Depression screen PHQ 2/9 08/11/2017  Decreased Interest 0  Down, Depressed, Hopeless 0  PHQ - 2 Score 0    Social History   Tobacco Use  . Smoking status: Current Every Day Smoker    Packs/day: 0.50    Years: 30.00    Pack years: 15.00    Types: Cigarettes  . Smokeless tobacco: Current User  Substance Use Topics  . Alcohol use: Never    Frequency: Never  . Drug use: Never    Review of Systems Per HPI unless specifically indicated above     Objective:    BP 130/61   Pulse 89   Temp 98.4 F (36.9 C) (Oral)   Resp 16   Ht 5\' 8"  (1.727 m)   Wt 178 lb (80.7 kg)   SpO2 99%   BMI 27.06 kg/m   Wt Readings from Last 3 Encounters:  12/10/17 178 lb (80.7 kg)  08/31/17 177 lb 9.6 oz (80.6 kg)  08/25/17 174 lb 3.2 oz (79 kg)    Physical Exam  Constitutional: She is oriented to person, place, and time. She appears well-developed and well-nourished. No distress.  Well-appearing, uncomfortable with sinus, cooperative  HENT:  Head: Normocephalic and atraumatic.  Mouth/Throat: Oropharynx is clear and moist.  Maxillary sinuses mild tender bilateral. Nares with some deeper turbinate edema with congestion without purulence. R TM with mild clear effusion and fullness without erythema or bulging. L TM clear without abnormality. Oropharynx  clear without erythema, exudates, edema or asymmetry.  Eyes: Conjunctivae are normal. Right eye exhibits no discharge. Left eye exhibits no discharge.  Neck: No thyromegaly present.  Neck Inspection / Palpation: Increased tissue texture changes especially R side paraspinal cervical muscles with redness remaining after palpation. Slight warmth and hypertonicity with some mild tenderness. Without distinct muscle knot or localized trigger point. Involving trapezius muscles as well down into upper shoulder ROM: mostly intact range of motion flex/ext/rotation Strength: distal intact Neurovascular: distal intact    Cardiovascular: Normal rate, regular rhythm, normal heart sounds and intact distal pulses.  No murmur heard. Pulmonary/Chest: Effort normal and breath sounds normal. No respiratory distress. She has no wheezes. She has no rales.  Good air movement. No coughing.  Abdominal: Soft. Bowel sounds are normal. She exhibits no distension. There is no tenderness. There is no guarding.  Musculoskeletal: Normal range of motion. She exhibits no edema.  No CVAT  Low Back Inspection: Normal appearance, no spinal deformity obvious, symmetrical. Palpation: No tenderness over spinous processes.  - Mild residual R lower lumbar paraspinal muscle hypertonicity without tenderness ROM: Full active ROM forward flex / back extension, rotation L/R without discomfort Special Testing: Seated SLR negative for radicular pain bilaterally  Strength: Bilateral hip flex/ext 5/5, knee flex/ext 5/5, ankle dorsiflex/plantarflex 5/5 Neurovascular: intact distal sensation to light touch   Lymphadenopathy:    She has no cervical adenopathy.  Neurological: She is alert and oriented to person, place, and time.  Skin: Skin is warm and dry. No rash noted. She is not diaphoretic. No erythema.  Psychiatric: She has a normal mood and affect. Her behavior is normal.  Well groomed, good eye contact, normal speech and thoughts  Nursing note and vitals reviewed.  Results for orders placed or performed in visit on 12/10/17  POCT Urinalysis Dipstick  Result Value Ref Range   Color, UA yellow    Clarity, UA cloudy    Glucose, UA Negative Negative   Bilirubin, UA neg    Ketones, UA neg    Spec Grav, UA 1.010 1.010 - 1.025   Blood, UA trace    pH, UA 5.0 5.0 - 8.0   Protein, UA Negative Negative   Urobilinogen, UA 0.2 0.2 or 1.0 E.U./dL   Nitrite, UA positive    Leukocytes, UA Moderate (2+) (A) Negative   Appearance cloudy    Odor present       Assessment & Plan:   Problem List Items Addressed This Visit    None     Visit Diagnoses    Acute cystitis with hematuria    -  Primary  Clinically consistent with UTI and confirmed on UA, symptoms present for now 3 weeks had interval improvement now return. Last antibiotic for diverticulitis Cipro 3 months ago. No concern for pyelo today (no systemic symptoms, n/v - note her back pain is unrelated on exam and has improved, also low grade temp was not consistent with timeline of UTI)  Plan: 1. UA positive 2. Ordered Urine culture 3. Augmentin BID x 10 days - cover both Sinusitis and Cystitis - deferred Keflex since she had prior intolerance on this GI upset. Considered Macrobid but this would be UTI specific not sinus. Considered Cipro but less sinus coverage and she uses cipro for diverticulitis would prefer to avoid. Consider levaquin - will use if unresolved. 4. Improve PO hydration 5. RTC if no improvement 1-2 weeks, red flags given to return sooner     Relevant Medications  amoxicillin-clavulanate (AUGMENTIN) 875-125 MG tablet   Other Relevant Orders   POCT Urinalysis Dipstick   Urine Culture   Acute non-recurrent maxillary sinusitis      Consistent with acute maxillary sinusitis, likely initially viral URI vs allergic rhinitis component with worsening concern for bacterial infection now with increased duration and worsening symptoms, characteristic sinus pain and pressure and temp.  Plan: 1. Start Augmentin 875-125mg  PO BID x 10 days - also for UTI 2. Start Atrovent nasal spray decongestant 2 sprays in each nostril up to 4 times daily for 7 days 3. Continue antihistamine - may add OTC Pseudophed decongestant temporarily 4. Supportive care with nasal saline OTC, hydration 5. Return criteria reviewed     Relevant Medications   amoxicillin-clavulanate (AUGMENTIN) 875-125 MG tablet   ipratropium (ATROVENT) 0.06 % nasal spray   Strain of right trapezius muscle, initial encounter      Persistent R > L bilateral cervical paraspinal muscle spasm,  uncertain trigger or etiology. Likely some underlying OA/DJD. Without complications. No evidence of radicular symptoms or neurological deficits or weakness. - No recent X-ray. Has seen chiropractor seemed to worsen muscle spasm.  Plan Recommend conservative care, she declines additional medicine at this time. Seems to gradually improve May use soft tissue gentle massage Heat and ice alternating Future consider muscle relaxant - call us if interested would send lowest dose Baclofen PRN - caution sedation. May take NSAID / Tylenol as well. May consider referral to PT if need exercises     Acute right-sided low back pain with right-sided sciatica     Improving LBP now with resolving R sided sciatica. Lumbar X-ray per Chiro with DJD Continue current conservative care. May resume chiropractor as needed in future with good results.  If sciatica flare up again consider Prednisone taper. Also considered prednisone for sinus if worsening.         Meds ordered this encounter  Medications  . amoxicillin-clavulanate (AUGMENTIN) 875-125 MG tablet    Sig: Take 1 tablet by mouth 2 (two) times daily. For 10 days    Dispense:  20 tablet    Refill:  0  . ipratropium (ATROVENT) 0.06 % nasal spray    Sig: Place 2 sprays into both nostrils 4 (four) times daily. For up to 5-7 days then stop.    Dispense:  15 mL    Refill:  0    Follow up plan: Return in about 2 weeks (around 12/24/2017), or if symptoms worsen or fail to improve, for sinus UTI / neck spasm.   Nobie Putnam, [facility] 12/10/2017, 10:06 AM

## 2017-12-10 NOTE — Patient Instructions (Addendum)
Thank you for coming to the office today.  1. It sounds like you have a Sinusitis (Bacterial Infection) - this most likely started as an Upper Respiratory Virus that has settled into an infection. Allergies can also cause this. - Start Augmentin 1 pill twice daily (breakfast and dinner, with food and plenty of water) for 10 days, complete entire course, do not stop early even if feeling better  ALSO FOR UTI  You have a Urinary Tract Infection - this is very common, your symptoms are reassuring and you should get better within 1 week on the antibiotics Augmentin antibiotic - complete entire course, even if feeling better - We sent urine for a culture, we will call you within next few days if we need to change antibiotics - Please drink plenty of fluids, improve hydration over next 1 week  If symptoms worsening, developing nausea / vomiting, worsening back pain, fevers / chills / sweats, then please return for re-evaluation sooner.  ---------- Start Atrovent nasal spray decongestant 2 sprays in each nostril up to 4 times daily for 7 days  Resume Loratadine (Claritin) 10mg  daily  Check decongestant - need pseudophed (may try the OTC phenylephrine but less likely to work)  - Recommend to may keep using Nasal Saline spray multiple times a day to help flush out congestion and clear sinuses - Improve hydration by drinking plenty of clear fluids (water, gatorade) to reduce secretions and thin congestion - Congestion draining down throat can cause irritation. May try warm herbal tea with honey, cough drops - Can take Tylenol or Ibuprofen as needed for fevers  If you develop persistent fever >101F for at least 3 consecutive days, headaches with sinus pain or pressure or persistent earache, please schedule a follow-up evaluation within next few days to week.  For neck likely muscle spasm of trapezius May do more gentle soft tissue massage at home, use biofreeze, alternating ice and heat Notify  office if interested in muscle relaxant either Baclofen   If needed - may start taking Baclofen (Lioresal) 10mg  (muscle relaxant) - start with half (cut) to one whole pill at night as needed for next 1-3 nights (may make you drowsy, caution with driving) see how it affects you, then if tolerated increase to one pill 2 to 3 times a day or (every 8 hours as needed)  Please schedule a Follow-up Appointment to: Return in about 2 weeks (around 12/24/2017), or if symptoms worsen or fail to improve, for sinus UTI / neck spasm.  If you have any other questions or concerns, please feel free to call the office or send a message through [place]. You may also schedule an earlier appointment if necessary.  Additionally, you may be receiving a survey about your experience at our office within a few days to 1 week by e-mail or mail. We value your feedback.  Nobie Putnam, DO [place]

## 2017-12-12 LAB — URINE CULTURE
MICRO NUMBER:: 91069636
SPECIMEN QUALITY:: ADEQUATE

## 2018-01-12 ENCOUNTER — Telehealth: Payer: Self-pay | Admitting: Family Medicine

## 2018-01-12 NOTE — Telephone Encounter (Signed)
Patient called with some persistent UTI symptoms after 1 month, I took the call in triage, she was treated 1 month ago with Augmentin for sinusitis and UTI, confirmed E Coli on culture, had multiple resistances but sensitive to Augmentin.  Her sinus cleared up. UTI resolved but still had cloudy urine and some odor, this has persisted now for 4-5 weeks.  I advised her if not dysuria, pain, discomfort, bleeding or other concerning symptoms fever chills nausea vomiting then we would not consider it UTI at this time. Maybe asymptomatic bacteruria.  She does not endorse symptoms of yeast infection either at this time.  I advised her that if not improved in 1-2 weeks, she may call back to schedule for follow-up office visit to check UA / Urine Culture - again may be colonization causing these symptoms, may not warrant continued repeat antibiotics.  If symptoms change, she will follow-up as advised.  Julie Pena, [facility] 01/12/2018, 1:26 PM

## 2018-02-25 ENCOUNTER — Other Ambulatory Visit: Payer: Self-pay | Admitting: Family Medicine

## 2018-02-25 ENCOUNTER — Ambulatory Visit (INDEPENDENT_AMBULATORY_CARE_PROVIDER_SITE_OTHER): Payer: Medicare HMO | Admitting: Family Medicine

## 2018-02-25 ENCOUNTER — Encounter: Payer: Self-pay | Admitting: Family Medicine

## 2018-02-25 VITALS — BP 123/66 | HR 79 | Temp 98.7°F | Resp 16 | Ht 68.0 in | Wt 179.0 lb

## 2018-02-25 DIAGNOSIS — M25561 Pain in right knee: Secondary | ICD-10-CM

## 2018-02-25 DIAGNOSIS — E663 Overweight: Secondary | ICD-10-CM | POA: Insufficient documentation

## 2018-02-25 DIAGNOSIS — M25562 Pain in left knee: Secondary | ICD-10-CM

## 2018-02-25 DIAGNOSIS — E785 Hyperlipidemia, unspecified: Secondary | ICD-10-CM | POA: Insufficient documentation

## 2018-02-25 DIAGNOSIS — M2550 Pain in unspecified joint: Secondary | ICD-10-CM | POA: Diagnosis not present

## 2018-02-25 DIAGNOSIS — R8271 Bacteriuria: Secondary | ICD-10-CM | POA: Diagnosis not present

## 2018-02-25 DIAGNOSIS — G8929 Other chronic pain: Secondary | ICD-10-CM | POA: Diagnosis not present

## 2018-02-25 DIAGNOSIS — Z0000 Encounter for general adult medical examination without abnormal findings: Secondary | ICD-10-CM

## 2018-02-25 DIAGNOSIS — Z79890 Hormone replacement therapy (postmenopausal): Secondary | ICD-10-CM

## 2018-02-25 DIAGNOSIS — R7309 Other abnormal glucose: Secondary | ICD-10-CM

## 2018-02-25 LAB — POCT URINALYSIS DIPSTICK
Bilirubin, UA: NEGATIVE
Glucose, UA: NEGATIVE
Ketones, UA: NEGATIVE
Nitrite, UA: POSITIVE
Protein, UA: NEGATIVE
Spec Grav, UA: 1.01 (ref 1.010–1.025)
Urobilinogen, UA: 0.2 E.U./dL
pH, UA: 5 (ref 5.0–8.0)

## 2018-02-25 NOTE — Patient Instructions (Addendum)
Thank you for coming to the office today.  [facility]. - Need Prevnar-13 (initial Pneumonia Vaccine >age 67) - then 1 year due for Pneumovax-23 - 2nd final dose  For Urine - we will send culture, stay tuned next week for results, if consistent with Infection UTI then we will notify you and send antibiotic. If keeps happening we will refer to Urology for bladder testing. Try to empty bladder and stay hydrated. If signs of UTI or worsening symptoms seek care.  Recommend to start taking Tylenol Extra Strength 500mg  tabs - take 1 to 2 tabs per dose (max 1000mg ) every 6-8 hours for pain (take regularly, don't skip a dose for next 7 days), max 24 hour daily dose is 6 tablets or 3000mg . In the future you can repeat the same everyday Tylenol course for 1-2 weeks at a time.  - This is safe to take with anti-inflammatory medicines (Ibuprofen, Advil)  In future we can consider a muscle relaxant if it ends up being more muscular or strain.  Also consider strengthening program for muscles - either at gym or through a physical therapist.  DUE for FASTING BLOOD WORK (no food or drink after midnight before the lab appointment, only water or coffee without cream/sugar on the morning of)  SCHEDULE "Lab Only" visit in the morning at the clinic for lab draw in [place]   - Make sure Lab Only appointment is at about 1 week before your next appointment, so that results will be available  For Lab Results, once available within 2-3 days of blood draw, you can can log in to MyChart online to view your results and a brief explanation. Also, we can discuss results at next follow-up visit.   Please schedule a Follow-up Appointment to: Return in about 6 months (around 08/26/2018) for Annual Physical.  If you have any other questions or concerns, please feel free to call the office or send a message through [place]. You may also schedule an earlier appointment if  necessary.  Additionally, you may be receiving a survey about your experience at our office within a few days to 1 week by e-mail or mail. We value your feedback.  Nobie Putnam, DO [place]

## 2018-02-25 NOTE — Progress Notes (Signed)
Subjective:    Patient ID: Julie Pena, female    DOB: [birth year], 67 y.o.   MRN: [id number]  Julie Pena is a 67 y.o. female presenting on 02/25/2018 for Weight Check; Urinary Tract Infection; and Knee Pain   HPI   Overweight BMI >27 / Weight Follow-up She has not changed much lifestyle in past >6 months. Acknowledges still has goals to improve this. Last sugar A1c 5.4, no history of problem. Not due for repeat Lifestyle - Wt up 1 lb in 6 months - Diet: mostly balanced - now improved water intake, she has limited portions, drinking some unsweet decaf tea with stevia - Exercise: Limited.Gym member but has not started going regularly yet.  History of recurrent UTI Recently she feels like she has some difficulty with emptying bladder, some dripping at end and unable to completely empty bladder. But she tries to empty. She has some urinary frequency but no actual UTI symptoms of dysuria or fever or chills. She does not feel like current UTI. Had similar back in 12/2017, treated with Augmentin and symptoms resolved after 3 days, had confirmed E Coli UTI at that time. Has not seen Urologist for this, except as child for bladder dilatation - Denies dysuria, hematuria, abdominal pain, flank pain, fever chills  Pain in multiple joints / Bilateral Knee, Hip, and Low Back Pain She describes episodes of muscle and joint pain in these joints, usually symmetrical, some days present other days improved. She takes occasional Advil Ibuprofen 1-2 once in a while with good results. Not taking Tylenol. She prefers not to take medicine. No prior x-ray. - Prior history R knee injury twisting and meniscus repair by Dr Marry Guan years ago, has done well - No clear history of arthritis - Denies any injury, swelling, trauma, redness  Health Maintenance: UTD Flu Vaccine 02/05/18  Due Prevnar-13 1st dose age >80 - she will go to pharmacy, bc no vaccines covered here at PCP office by her report.  Depression  screen PHQ 2/9 08/11/2017  Decreased Interest 0  Down, Depressed, Hopeless 0  PHQ - 2 Score 0    Social History   Tobacco Use  . Smoking status: Current Every Day Smoker    Packs/day: 0.50    Years: 30.00    Pack years: 15.00    Types: Cigarettes  . Smokeless tobacco: Current User  Substance Use Topics  . Alcohol use: Never    Frequency: Never  . Drug use: Never    Review of Systems Per HPI unless specifically indicated above     Objective:    BP 123/66   Pulse 79   Temp 98.7 F (37.1 C) (Oral)   Resp 16   Ht 5\' 8"  (1.727 m)   Wt 179 lb (81.2 kg)   BMI 27.22 kg/m   Wt Readings from Last 3 Encounters:  02/25/18 179 lb (81.2 kg)  12/10/17 178 lb (80.7 kg)  08/31/17 177 lb 9.6 oz (80.6 kg)    Physical Exam  Constitutional: She is oriented to person, place, and time. She appears well-developed and well-nourished. No distress.  Well-appearing, comfortable, cooperative  HENT:  Head: Normocephalic and atraumatic.  Mouth/Throat: Oropharynx is clear and moist.  Eyes: Conjunctivae are normal. Right eye exhibits no discharge. Left eye exhibits no discharge.  Cardiovascular: Normal rate and intact distal pulses.  Pulmonary/Chest: Effort normal.  Abdominal: Soft. Bowel sounds are normal. She exhibits no distension.  Musculoskeletal: Normal range of motion. She exhibits no edema.  Bilateral  Knees Inspection: Normal appearance and symmetrical. No ecchymosis or effusion. Palpation: Non-tender. Very mild crepitus R>L ROM: Full active ROM bilaterally - increased flexibility vs laxity of knee joints it seems. Special Testing: Lachman / Valgus/Varus tests negative with intact ligaments (ACL, MCL, LCL). Strength: 5/5 intact knee flex/ext, ankle dorsi/plantarflex Neurovascular: distally intact sensation light touch and pulses  Low Back / Bilateral Hips Inspection: BACK - Normal appearance, no spinal deformity, symmetrical. HIP - Normal appearance, symmetrical, no obvious leg  length or pelvis deformity  Palpation: BACK - No tenderness over spinous processes. Bilateral lumbar paraspinal muscles non-tender and without hypertonicity/spasm.  HIP - Non tender to palpation deeper greater trochanter region of lateral upper thigh. Lower extremity thigh calf soft non tender no spasm.  ROM: BACK - Full active ROM forward flex / back extension, rotation L/R without discomfort HIP - Bilateral hip flex/ext supine normal, internal and external rotation normal without problem or limitation.  Special Testing: BACK - Seated SLR negative for radicular pain bilaterally HIP - FABER FADIR normal and non tender no limited movement. Acetabular compression of hip normal  Strength: Bilateral hip flex/ext 5/5, knee flex/ext 5/5, ankle dorsiflex/plantarflex 5/5 Neurovascular: intact distal sensation to light touch    Neurological: She is alert and oriented to person, place, and time.  Skin: Skin is warm and dry. No rash noted. She is not diaphoretic. No erythema.  Psychiatric: She has a normal mood and affect. Her behavior is normal.  Well groomed, good eye contact, normal speech and thoughts  Nursing note and vitals reviewed.  Results for orders placed or performed in visit on 02/25/18  POCT Urinalysis Dipstick  Result Value Ref Range   Color, UA amber    Clarity, UA cloudy    Glucose, UA Negative Negative   Bilirubin, UA negative    Ketones, UA negative    Spec Grav, UA 1.010 1.010 - 1.025   Blood, UA trace    pH, UA 5.0 5.0 - 8.0   Protein, UA Negative Negative   Urobilinogen, UA 0.2 0.2 or 1.0 E.U./dL   Nitrite, UA positive    Leukocytes, UA Moderate (2+) (A) Negative   Appearance cloudy    Odor foul       Assessment & Plan:   Problem List Items Addressed This Visit    Overweight (BMI 25.0-29.9) Wt stable without significant change Encourage improve lifestyle goal wt loss     Other Visit Diagnoses    Asymptomatic bacteriuria    -  Primary Asymptomatic except  urinary odor and appearance Clinically with another episode of urinary symptoms, not consistent with clinical UTI Previous UTI 12/2017 E Coli with some antibiotic resistances 12/2017, treated with Augmentin Concern with recurrent UTI vs colonization  Plan UA suggestive of UTI Check urine culture - pending result will consider antibiotic Follow-up course - in future continued recurrent UTI issues - consider refer to Urology may benefit from PVR / urodynamics    Relevant Orders   POCT Urinalysis Dipstick (Completed)   Urine Culture   Pain in joint, multiple sites       Chronic pain of both knees      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  Follow up plan: Return in about 6 months (around 08/26/2018) for Annual Physical.  Future labs ordered for 08/22/18  Nobie Putnam, [facility] 02/25/2018, 9:32 AM

## 2018-02-27 LAB — URINE CULTURE
MICRO NUMBER:: 91412385
SPECIMEN QUALITY:: ADEQUATE

## 2018-02-28 ENCOUNTER — Telehealth: Payer: Self-pay | Admitting: Family Medicine

## 2018-02-28 DIAGNOSIS — N3001 Acute cystitis with hematuria: Secondary | ICD-10-CM

## 2018-02-28 MED ORDER — NITROFURANTOIN MONOHYD MACRO 100 MG PO CAPS: 100.0000 mg | ORAL_CAPSULE | Freq: Two times a day (BID) | ORAL | 0 refills | Status: DC

## 2018-02-28 NOTE — Telephone Encounter (Signed)
[facility] [place]  Her call back number is [phone]

## 2018-02-28 NOTE — Telephone Encounter (Signed)
Called patient about Urine Culture results, E Coli UTI with resistances to some antibiotics, only options seem to be Keflex or Macrobid, she had GI intolerance on keflex, will try macrobid 100 twice a day x 7 days. Sent rx to [place]  She states her symptoms have remained fairly persistent, now seems some urgency and more frequency. Still odor and cloudy.  Follow-up in future if recurrent UTI. She is asking if should avoid baths and switch to showers, reviewed UTI precautions  Julie Pena, [facility] 02/28/2018, 12:26 PM

## 2018-03-18 ENCOUNTER — Telehealth: Payer: Self-pay | Admitting: *Deleted

## 2018-03-18 DIAGNOSIS — Z122 Encounter for screening for malignant neoplasm of respiratory organs: Secondary | ICD-10-CM

## 2018-03-18 DIAGNOSIS — Z87891 Personal history of nicotine dependence: Secondary | ICD-10-CM

## 2018-03-18 NOTE — Telephone Encounter (Signed)
Received referral for initial lung cancer screening scan. Contacted patient and obtained smoking history,(current, 35.25 pack year) as well as answering questions related to screening process. Patient denies signs of lung cancer such as weight loss or hemoptysis. Patient denies comorbidity that would prevent curative treatment if lung cancer were found. Patient is scheduled for shared decision making visit and CT scan on 04/07/2018 at 145pm.

## 2018-04-07 ENCOUNTER — Encounter: Payer: Self-pay | Admitting: Nurse Practitioner

## 2018-04-07 ENCOUNTER — Inpatient Hospital Stay: Payer: Medicare HMO | Attending: Nurse Practitioner | Admitting: Nurse Practitioner

## 2018-04-07 ENCOUNTER — Ambulatory Visit
Admission: RE | Admit: 2018-04-07 | Discharge: 2018-04-07 | Disposition: A | Discharge status: Home / Self Care | Payer: Medicare HMO | Source: Ambulatory Visit | Attending: Nurse Practitioner | Admitting: Nurse Practitioner

## 2018-04-07 DIAGNOSIS — Z87891 Personal history of nicotine dependence: Secondary | ICD-10-CM

## 2018-04-07 DIAGNOSIS — Z122 Encounter for screening for malignant neoplasm of respiratory organs: Secondary | ICD-10-CM | POA: Diagnosis not present

## 2018-04-07 DIAGNOSIS — F17210 Nicotine dependence, cigarettes, uncomplicated: Secondary | ICD-10-CM | POA: Diagnosis not present

## 2018-04-07 NOTE — Progress Notes (Signed)
In accordance with CMS guidelines, patient has met eligibility criteria including age, absence of signs or symptoms of lung cancer.  Social History   Tobacco Use  . Smoking status: Current Every Day Smoker    Packs/day: 0.75    Years: 47.00    Pack years: 35.25    Types: Cigarettes  . Smokeless tobacco: Current User  Substance Use Topics  . Alcohol use: Never    Frequency: Never  . Drug use: Never      A shared decision-making session was conducted prior to the performance of CT scan. This includes one or more decision aids, includes benefits and harms of screening, follow-up diagnostic testing, over-diagnosis, false positive rate, and total radiation exposure.   Counseling on the importance of adherence to annual lung cancer LDCT screening, impact of co-morbidities, and ability or willingness to undergo diagnosis and treatment is imperative for compliance of the program.   Counseling on the importance of continued smoking cessation for former smokers; the importance of smoking cessation for current smokers, and information about tobacco cessation interventions have been given to patient including [place] and 1800 quit [place] programs.   Written order for lung cancer screening with LDCT has been given to the patient and any and all questions have been answered to the best of my abilities.    Yearly follow up will be coordinated by Burgess Estelle, Thoracic Navigator.  Beckey Rutter, DNP, AGNP-C [place] at [facility] [phone] (work cell) [phone] (office) 04/07/18 4:46 PM

## 2018-04-08 ENCOUNTER — Encounter: Payer: Self-pay | Admitting: *Deleted

## 2018-06-29 ENCOUNTER — Telehealth (INDEPENDENT_AMBULATORY_CARE_PROVIDER_SITE_OTHER): Payer: Medicare HMO | Admitting: Family Medicine

## 2018-06-29 ENCOUNTER — Other Ambulatory Visit: Payer: Self-pay

## 2018-06-29 ENCOUNTER — Encounter: Payer: Self-pay | Admitting: Family Medicine

## 2018-06-29 DIAGNOSIS — K5792 Diverticulitis of intestine, part unspecified, without perforation or abscess without bleeding: Secondary | ICD-10-CM

## 2018-06-29 MED ORDER — CIPROFLOXACIN HCL 500 MG PO TABS: 500.0000 mg | ORAL_TABLET | Freq: Two times a day (BID) | ORAL | 0 refills | Status: DC

## 2018-06-29 NOTE — Patient Instructions (Addendum)
Thank you for coming to the office today.  Start Cipro antibiotic  If not better in 48 hours or need Flagyl call us and let us know, or if you need nausea med zofran  For Constipation (less frequent bowel movement that can be hard dry or involve straining).  Recommend trying OTC Miralax 17g = 1 capful in large glass water once daily for now, try several days to see if working, goal is soft stool or BM 1-2 times daily, if too loose then reduce dose or try every other day. If not effective may need to increase it to 2 doses at once in AM or may do 1 in morning and 1 in afternoon/evening  - This medicine is very safe and can be used often without any problem and will not make you dehydrated. It is good for use on AS NEEDED BASIS or even MAINTENANCE therapy for longer term for several days to weeks at a time to help regulate bowel movements  Other more natural remedies or preventative treatment: - Increase hydration with water - Increase fiber in diet (high fiber foods = vegetables, leafy greens, oats/grains) - May take OTC Fiber supplement (metamucil powder or pill/gummy) - May try OTC Probiotic  We can refer to GI within 1 year  Please schedule a Follow-up Appointment to: No follow-ups on file.  If you have any other questions or concerns, please feel free to call the office or send a message through [place]. You may also schedule an earlier appointment if necessary.  Additionally, you may be receiving a survey about your experience at our office within a few days to 1 week by e-mail or mail. We value your feedback.  Nobie Putnam, DO [place]

## 2018-06-29 NOTE — Progress Notes (Signed)
As per patient has same Sxs like diverticulitis in past, BM changes onset week, nausea, LLQ, keep herself hydrated, gets worst at night.

## 2018-06-29 NOTE — Progress Notes (Signed)
Virtual Visit via Telephone The purpose of this virtual visit is to provide medical care while limiting exposure to the novel coronavirus (COVID19) for both patient and office staff.  Consent was obtained for phone visit:  Yes.   Answered questions that patient had about telehealth interaction:  Yes.   I discussed the limitations, risks, security and privacy concerns of performing an evaluation and management service by telephone. I also discussed with the patient that there may be a patient responsible charge related to this service. The patient expressed understanding and agreed to proceed.  Patient Location: Home Provider Location: [facility] (Office)  ---------------------------------------------------------------------- CC: Left Lower Abdominal Pain / Suspected Diverticulitis  S: Reviewed CMA telephone note below. I have called patient and gathered additional HPI as follows:  ACUTE LLQ ABDOMINAL PAIN / PRESUMED DIVERTICULITIS - Last visit with me 08/2017, for last episode of same problem, treated with Cipro 500mg  BID x 7 days did not require any added Flagyl at that time, see prior notes for background information.  - Today patient reports that she is concerned for new recurrent flare of diverticulitis, she usually can tell with bowel changes, may have episode of diarrhea, then will have slow down of her GI tract and constipation, and then things often get worse, worse in past when she eats corn / popcorn in past, she recently some [place] stew 1 month, and seems to think that this was a possible trigger - She has adjusted her diet past 2 weeks ago, limiting possible trigger foods, and even lost some weight - Now pain started 1 week ago with intermittent LLQ moderate to severe stomach pain, and some gas pain that is relieved after passes gas, and it is episodic, worse at night. - Irregular BMs now, with constipation - Taking OTC Ibuprofen PRN - Taking Gas X PRN - Not  taking stool softener - Admits mild nausea, without vomiting - Denies any fevers chills sweats, rectal bleeding or blood per stool, hematuria, dysuria  Patient is currently home in isolation Denies any high risk travel to areas of current concern for COVID19. Denies any known or suspected exposure to person with or possibly with COVID19.  Admits nausea, abdominal pain, mucus in stool Denies any fevers, chills, sweats, body ache, cough, shortness of breath, diarrhea  Past Medical History:  Diagnosis Date  . Diverticulosis    Social History   Tobacco Use  . Smoking status: Current Every Day Smoker    Packs/day: 0.75    Years: 47.00    Pack years: 35.25    Types: Cigarettes  . Smokeless tobacco: Current User  Substance Use Topics  . Alcohol use: Never    Frequency: Never  . Drug use: Never    Current Outpatient Medications:  .  estradiol (ESTRACE) 0.5 MG tablet, Take 0.5 mg by mouth daily. , Disp: , Rfl: 3 .  fexofenadine-pseudoephedrine (ALLEGRA-D 24) 180-240 MG 24 hr tablet, Take 1 tablet by mouth daily., Disp: , Rfl:  .  Probiotic Product (ALIGN PO), Take by mouth. Take probiotic daily, Disp: , Rfl:  .  ciprofloxacin (CIPRO) 500 MG tablet, Take 1 tablet (500 mg total) by mouth 2 (two) times daily., Disp: 14 tablet, Rfl: 0  Depression screen [facility] 2/9 06/29/2018 08/11/2017  Decreased Interest 0 0  Down, Depressed, Hopeless 0 0  PHQ - 2 Score 0 0    No flowsheet data found.  -------------------------------------------------------------------------- O: No physical exam performed due to remote telephone encounter.  No results found  for this or any previous visit (from the past 2160 hour(s)).  -------------------------------------------------------------------------- A&P:  Problem List Items Addressed This Visit    None    Visit Diagnoses    Diverticulitis    -  Primary   Relevant Medications   ciprofloxacin (CIPRO) 500 MG tablet     Clinically consistent with acute  LLQ abdominal pain, similar to prior diverticulitis - with known triggers Last flare 08/2017, nearly same Tolerating PO and no concern of dehydration based on her intake  Plan Start empiric treatment with antibiotics - Cipro 500mg  BID x 7 days - advised low threshold to add 2nd antibiotic Metronidazole (Flagyl) 500mg  TID x 7 days if not improved significantly or any worsening within 24-48 hours by Thurs this week. - Add miralax for constipation - Offered Zofran ODT PRN nausea, she declined - Keep improving hydration - Follow-up within 1 week as needed Strict return criteria given when to go to hospital if not improving   Follow-up: - Return as needed if worsening  Patient verbalizes understanding with the above medical recommendations including the limitation of remote medical advice.  Specific follow-up and call-back criteria were given for patient to follow-up or seek medical care more urgently if needed.   - Time spent in direct consultation with patient on phone: 12 minutes  Nobie Putnam, [facility] 06/29/2018, 10:15 AM

## 2018-07-08 DIAGNOSIS — K5792 Diverticulitis of intestine, part unspecified, without perforation or abscess without bleeding: Secondary | ICD-10-CM

## 2018-07-08 MED ORDER — METRONIDAZOLE 500 MG PO TABS: 500.0000 mg | ORAL_TABLET | Freq: Three times a day (TID) | ORAL | 0 refills | Status: DC

## 2018-07-08 MED ORDER — CIPROFLOXACIN HCL 500 MG PO TABS: 500.0000 mg | ORAL_TABLET | Freq: Two times a day (BID) | ORAL | 0 refills | Status: DC

## 2018-08-22 ENCOUNTER — Other Ambulatory Visit: Payer: Medicare HMO

## 2018-08-26 ENCOUNTER — Encounter: Payer: Medicare HMO | Admitting: Family Medicine

## 2018-09-12 ENCOUNTER — Other Ambulatory Visit: Payer: Medicare HMO

## 2018-09-15 ENCOUNTER — Encounter: Payer: Medicare HMO | Admitting: Family Medicine

## 2018-11-03 ENCOUNTER — Ambulatory Visit: Payer: Medicare HMO

## 2018-11-03 ENCOUNTER — Other Ambulatory Visit: Payer: Self-pay

## 2018-11-03 DIAGNOSIS — Z0000 Encounter for general adult medical examination without abnormal findings: Secondary | ICD-10-CM

## 2018-11-03 DIAGNOSIS — Z79890 Hormone replacement therapy (postmenopausal): Secondary | ICD-10-CM

## 2018-11-03 DIAGNOSIS — E785 Hyperlipidemia, unspecified: Secondary | ICD-10-CM

## 2018-11-03 DIAGNOSIS — R7309 Other abnormal glucose: Secondary | ICD-10-CM | POA: Diagnosis not present

## 2018-11-04 LAB — CBC WITH DIFFERENTIAL/PLATELET
Absolute Monocytes: 292 cells/uL (ref 200–950)
Basophils Absolute: 22 {cells}/uL (ref 0–200)
Basophils Relative: 0.4 %
Eosinophils Absolute: 94 cells/uL (ref 15–500)
Eosinophils Relative: 1.7 %
HCT: 38.9 % (ref 35.0–45.0)
Hemoglobin: 12.9 g/dL (ref 11.7–15.5)
Lymphs Abs: 1716 cells/uL (ref 850–3900)
MCH: 30.7 pg (ref 27.0–33.0)
MCHC: 33.2 g/dL (ref 32.0–36.0)
MCV: 92.6 fL (ref 80.0–100.0)
MPV: 10.6 fL (ref 7.5–12.5)
Monocytes Relative: 5.3 %
Neutro Abs: 3377 cells/uL (ref 1500–7800)
Neutrophils Relative %: 61.4 %
Platelets: 172 10*3/uL (ref 140–400)
RBC: 4.2 10*6/uL (ref 3.80–5.10)
RDW: 13.7 % (ref 11.0–15.0)
Total Lymphocyte: 31.2 %
WBC: 5.5 10*3/uL (ref 3.8–10.8)

## 2018-11-04 LAB — COMPLETE METABOLIC PANEL WITH GFR
AG Ratio: 2.1 (calc) (ref 1.0–2.5)
ALT: 8 U/L (ref 6–29)
AST: 12 U/L (ref 10–35)
Albumin: 4.2 g/dL (ref 3.6–5.1)
Alkaline phosphatase (APISO): 62 U/L (ref 37–153)
BUN: 13 mg/dL (ref 7–25)
CO2: 29 mmol/L (ref 20–32)
Calcium: 9.3 mg/dL (ref 8.6–10.4)
Chloride: 108 mmol/L (ref 98–110)
Creat: 0.74 mg/dL (ref 0.50–0.99)
GFR, Est African American: 96 mL/min/{1.73_m2} (ref >=60)
GFR, Est Non African American: 83 mL/min/{1.73_m2} (ref >=60)
Globulin: 2 g/dL (calc) (ref 1.9–3.7)
Glucose, Bld: 81 mg/dL (ref 65–99)
Potassium: 4.4 mmol/L (ref 3.5–5.3)
Sodium: 142 mmol/L (ref 135–146)
Total Bilirubin: 0.6 mg/dL (ref 0.2–1.2)
Total Protein: 6.2 g/dL (ref 6.1–8.1)

## 2018-11-04 LAB — LIPID PANEL
Cholesterol: 177 mg/dL (ref <200)
HDL: 45 mg/dL — ABNORMAL LOW (ref >=50)
LDL Cholesterol (Calc): 104 mg/dL (calc) — ABNORMAL HIGH
Non-HDL Cholesterol (Calc): 132 mg/dL (calc) — ABNORMAL HIGH (ref <130)
Total CHOL/HDL Ratio: 3.9 (calc) (ref <5.0)
Triglycerides: 160 mg/dL — ABNORMAL HIGH (ref <150)

## 2018-11-04 LAB — HEMOGLOBIN A1C
Hgb A1c MFr Bld: 5.2 % of total Hgb (ref <5.7)
Mean Plasma Glucose: 103 (calc)
eAG (mmol/L): 5.7 (calc)

## 2018-11-04 LAB — TSH: TSH: 2.22 mIU/L (ref 0.40–4.50)

## 2018-11-10 ENCOUNTER — Other Ambulatory Visit: Payer: Self-pay

## 2018-11-10 ENCOUNTER — Ambulatory Visit (INDEPENDENT_AMBULATORY_CARE_PROVIDER_SITE_OTHER): Payer: Medicare HMO | Admitting: Family Medicine

## 2018-11-10 ENCOUNTER — Encounter: Payer: Self-pay | Admitting: Family Medicine

## 2018-11-10 VITALS — BP 120/64 | HR 81 | Temp 98.8°F | Resp 16 | Ht 68.0 in | Wt 166.8 lb

## 2018-11-10 DIAGNOSIS — Z79890 Hormone replacement therapy (postmenopausal): Secondary | ICD-10-CM

## 2018-11-10 DIAGNOSIS — K635 Polyp of colon: Secondary | ICD-10-CM

## 2018-11-10 DIAGNOSIS — R8271 Bacteriuria: Secondary | ICD-10-CM

## 2018-11-10 DIAGNOSIS — Z1239 Encounter for other screening for malignant neoplasm of breast: Secondary | ICD-10-CM | POA: Diagnosis not present

## 2018-11-10 DIAGNOSIS — Z1211 Encounter for screening for malignant neoplasm of colon: Secondary | ICD-10-CM

## 2018-11-10 DIAGNOSIS — E785 Hyperlipidemia, unspecified: Secondary | ICD-10-CM | POA: Diagnosis not present

## 2018-11-10 DIAGNOSIS — R8290 Unspecified abnormal findings in urine: Secondary | ICD-10-CM | POA: Diagnosis not present

## 2018-11-10 DIAGNOSIS — I700 Atherosclerosis of aorta: Secondary | ICD-10-CM | POA: Diagnosis not present

## 2018-11-10 DIAGNOSIS — K5790 Diverticulosis of intestine, part unspecified, without perforation or abscess without bleeding: Secondary | ICD-10-CM | POA: Diagnosis not present

## 2018-11-10 DIAGNOSIS — Z0000 Encounter for general adult medical examination without abnormal findings: Secondary | ICD-10-CM | POA: Diagnosis not present

## 2018-11-10 DIAGNOSIS — J439 Emphysema, unspecified: Secondary | ICD-10-CM | POA: Diagnosis not present

## 2018-11-10 DIAGNOSIS — E663 Overweight: Secondary | ICD-10-CM

## 2018-11-10 LAB — POCT URINALYSIS DIPSTICK
Bilirubin, UA: NEGATIVE
Glucose, UA: NEGATIVE
Ketones, UA: NEGATIVE
Leukocytes, UA: NEGATIVE
Nitrite, UA: POSITIVE
Protein, UA: NEGATIVE
Spec Grav, UA: 1.01 (ref 1.010–1.025)
Urobilinogen, UA: 0.2 E.U./dL
pH, UA: 5 (ref 5.0–8.0)

## 2018-11-10 NOTE — Assessment & Plan Note (Signed)
Due for routine colon cancer screening - anticipate within 2-3 years before next colonoscopy - Deferred FIT Test today, advised to do Cologuard instead  - Discussion today about recommendations for either Colonoscopy or Cologuard screening, benefits and risks of screening, interested in Cologuard, understands that if positive then recommendation is for diagnostic colonoscopy to follow-up. - Ordered Cologuard today

## 2018-11-10 NOTE — Assessment & Plan Note (Signed)
Stable, low dose HRT DIscussed tapering off med as a trial F/u if need

## 2018-11-10 NOTE — Progress Notes (Signed)
Subjective:    Patient ID: Julie Pena, female    DOB: 02/28/1951, 68 y.o.   MRN: [id number]  Julie Pena is a 68 y.o. female presenting on 11/10/2018 for Annual Exam   HPI   Here for Annual Physical and Lab Review.  History of endometriosis/ Postmenopausal Estrogen Deficiency S/p partial hysterectomy and then 2nd complete Postmenopausal Estrogen Deficiency She has been on chronic HRT,Premarin, now down to HALF of0.5 one pillevery 3 days, trying to wean off in future.  PMH - Diverticulosis - last flare up 06/2018  History of recurrent UTI vs Asymptomatic Bacteruria Chronic problem.  Currently asymptomatic. History previuosly of recurrent UTI, req bladder dilatation as child Now she describes intermittent episodes of cloudy urine with some odor, seems come and go, without any dysuria, hematuria, pelvic or flank pain, urgency frequency Request urine sample today Previous history E Coli UTI with resistances.  LIFESTYLE / WELLNESS / BMI >25 Weight down 16 lbs - Diet is balanced now reduced portion sizes, and she avoids snacking and any carbs between meals, drinks a lot of water One tablespoon daily, apple cider vinegar with mother    Health Maintenance:  Cervical CA Screening: S/p initial partial hysterectomy then total hysterectomy including removal of cervix. She had pap smear still completed by Julie Pena, reportedly normal. No prior abnormal screening, now age >16 without cervix  Breast CA Screening:  Due for mammogram screening. Last mammogram result1 year ago - negative. No prior history abnormal mammogram. No known family history of breast cancer. Currently asymptomatic. DUE to schedule [facility] in [facility], order placed  Colon CA Screening:  Last Colonoscopy (doneapprox 3-5 years agobyuncertain but done local in [place]), results withmultiple benign polyps, good for10years per her, do not have copy of report. Currently asymptomatic. No known  family history of colon CA. - Never able to track down her last colonoscopy record. - She has [employer] FIT test kit today, but discussed cologuard she would like to proceed w/ Cologuard now  Due for initial pneumonia vaccine at age >53 - she deferred Prevnar-13 and will return for nurse only visit for this vaccine then next year for pneumovax-23   Depression screen [facility] 2/9 11/10/2018 06/29/2018 08/11/2017  Decreased Interest 0 0 0  Down, Depressed, Hopeless 0 0 0  PHQ - 2 Score 0 0 0    Past Medical History:  Diagnosis Date  . Diverticulosis    Past Surgical History:  Procedure Laterality Date  . ABDOMINAL HYSTERECTOMY  1988   Initial partial 1978, then repeat TOTAL 1988  . BREAST EXCISIONAL BIOPSY Right mid 1980s   benign  . CATARACT EXTRACTION Bilateral 2018  . OOPHORECTOMY    . TONSILLECTOMY Bilateral 1963   Social History   Socioeconomic History  . Marital status: Married    Spouse name: Julie Pena  . Number of children: Not on file  . Years of education: Not on file  . Highest education level: Not on file  Occupational History  . Not on file  Social Needs  . Financial resource strain: Not on file  . Food insecurity    Worry: Not on file    Inability: Not on file  . Transportation needs    Medical: Not on file    Non-medical: Not on file  Tobacco Use  . Smoking status: Current Every Day Smoker    Packs/day: 0.75    Years: 47.00    Pack years: 35.25    Types: Cigarettes  .  Smokeless tobacco: Current User  Substance and Sexual Activity  . Alcohol use: Never    Frequency: Never  . Drug use: Never  . Sexual activity: Not on file  Lifestyle  . Physical activity    Days per week: Not on file    Minutes per session: Not on file  . Stress: Not on file  Relationships  . Social Herbalist on phone: Not on file    Gets together: Not on file    Attends religious service: Not on file    Active member of club or organization: Not on file    Attends  meetings of clubs or organizations: Not on file    Relationship status: Not on file  . Intimate partner violence    Fear of current or ex partner: Not on file    Emotionally abused: Not on file    Physically abused: Not on file    Forced sexual activity: Not on file  Other Topics Concern  . Not on file  Social History Narrative  . Not on file   Family History  Problem Relation Age of Onset  . Cancer Mother        kidney  . Kidney cancer Mother   . Cancer Father        lung  . Alcohol abuse Father   . Depression Father   . Lung cancer Father   . Colon cancer Neg Hx   . Breast cancer Neg Hx    Current Outpatient Medications on File Prior to Visit  Medication Sig  . estradiol (ESTRACE) 0.5 MG tablet Take 0.5 mg by mouth daily.   . fexofenadine-pseudoephedrine (ALLEGRA-D 24) 180-240 MG 24 hr tablet Take 1 tablet by mouth daily.  . Probiotic Product (ALIGN PO) Take by mouth. Take probiotic daily   No current facility-administered medications on file prior to visit.     Review of Systems  Constitutional: Negative for activity change, appetite change, chills, diaphoresis, fatigue and fever.  HENT: Negative for congestion and hearing loss.   Eyes: Negative for visual disturbance.  Respiratory: Negative for apnea, cough, chest tightness, shortness of breath and wheezing.   Cardiovascular: Negative for chest pain, palpitations and leg swelling.  Gastrointestinal: Negative for abdominal pain, constipation, diarrhea, nausea and vomiting.  Genitourinary: Negative for difficulty urinating, dysuria, frequency and hematuria.  Musculoskeletal: Negative for arthralgias and neck pain.  Skin: Negative for rash.  Allergic/Immunologic: Negative for environmental allergies.  Neurological: Negative for dizziness, weakness, light-headedness, numbness and headaches.  Hematological: Negative for adenopathy.  Psychiatric/Behavioral: Negative for behavioral problems, dysphoric mood and sleep  disturbance.   Per HPI unless specifically indicated above     Objective:    BP 120/64   Pulse 81   Temp 98.8 F (37.1 C) (Oral)   Resp 16   Ht _0  (1.727 m)   Wt 166 lb 12.8 oz (75.7 kg)   BMI 25.36 kg/m   Wt Readings from Last 3 Encounters:  11/10/18 166 lb 12.8 oz (75.7 kg)  04/07/18 160 lb (72.6 kg)  02/25/18 179 lb (81.2 kg)    Physical Exam Vitals signs and nursing note reviewed.  Constitutional:      General: She is not in acute distress.    Appearance: She is well-developed. She is not diaphoretic.     Comments: Well-appearing, comfortable, cooperative  HENT:     Head: Normocephalic and atraumatic.     Right Ear: Tympanic membrane, ear canal and external ear normal.  Left Ear: Tympanic membrane, ear canal and external ear normal.  Eyes:     General:        Right eye: No discharge.        Left eye: No discharge.     Conjunctiva/sclera: Conjunctivae normal.     Pupils: Pupils are equal, round, and reactive to light.  Neck:     Musculoskeletal: Normal range of motion and neck supple.     Thyroid: No thyromegaly.  Cardiovascular:     Rate and Rhythm: Normal rate and regular rhythm.     Heart sounds: Normal heart sounds. No murmur.  Pulmonary:     Effort: Pulmonary effort is normal. No respiratory distress.     Breath sounds: Normal breath sounds. No wheezing or rales.  Abdominal:     General: Bowel sounds are normal. There is no distension.     Palpations: Abdomen is soft. There is no mass.     Tenderness: There is no abdominal tenderness.  Musculoskeletal: Normal range of motion.        General: No tenderness.     Comments: Upper / Lower Extremities: - Normal muscle tone, strength bilateral upper extremities 5/5, lower extremities 5/5  Lymphadenopathy:     Cervical: No cervical adenopathy.  Skin:    General: Skin is warm and dry.     Findings: No erythema or rash.  Neurological:     Mental Status: She is alert and oriented to person, place, and  time.     Comments: Distal sensation intact to light touch all extremities  Psychiatric:        Behavior: Behavior normal.     Comments: Well groomed, good eye contact, normal speech and thoughts      I have personally reviewed the radiology report from CT on 04/07/18.  CLINICAL DATA:  68 year old asymptomatic female current smoker with 35.25 pack-year smoking history.  EXAM: CT CHEST WITHOUT CONTRAST LOW-DOSE FOR LUNG CANCER SCREENING  TECHNIQUE: Multidetector CT imaging of the chest was performed following the standard protocol without IV contrast.  COMPARISON:  None.  FINDINGS: Cardiovascular: Normal heart size. No significant pericardial effusion/thickening. Atherosclerotic nonaneurysmal thoracic aorta. Normal caliber pulmonary arteries.  Mediastinum/Nodes: No discrete thyroid nodules. Unremarkable esophagus. No pathologically enlarged axillary, mediastinal or hilar lymph nodes, noting limited sensitivity for the detection of hilar adenopathy on this noncontrast study.  Lungs/Pleura: No pneumothorax. No pleural effusion. Mild centrilobular emphysema. No acute consolidative airspace disease or lung masses. A few scattered tiny pulmonary nodules in both lungs, largest 2.0 mm in volume derived mean diameter in the left upper lobe (series 3/image 85).  Upper abdomen: Simple 1.9 cm left liver lobe cyst.  Musculoskeletal: No aggressive appearing focal osseous lesions. Moderate thoracic spondylosis.  IMPRESSION: Lung-RADS 2, benign appearance or behavior. Continue annual screening with low-dose chest CT without contrast in 12 months.  Aortic Atherosclerosis (ICD10-I70.0) and Emphysema (ICD10-J43.9).   Electronically Signed   By: Ilona Sorrel M.D.   On: 04/07/2018 16:12  CMP Latest Ref Rng & Units 11/03/2018 08/18/2017  Glucose 65 - 99 mg/dL 81 94  BUN 7 - 25 mg/dL 13 15  Creatinine 0.50 - 0.99 mg/dL 0.74 0.83  Sodium 135 - 146 mmol/L 142 141  Potassium  3.5 - 5.3 mmol/L 4.4 4.3  Chloride 98 - 110 mmol/L 108 107  CO2 20 - 32 mmol/L 29 30  Calcium 8.6 - 10.4 mg/dL 9.3 9.2  Total Protein 6.1 - 8.1 g/dL 6.2 6.3  Total Bilirubin 0.2 - 1.2  mg/dL 0.6 0.5  AST 10 - 35 U/L 12 15  ALT 6 - 29 U/L 8 14   Lipid Panel     Component Value Date/Time   CHOL 177 11/03/2018 0827   TRIG 160 (H) 11/03/2018 0827   HDL 45 (L) 11/03/2018 0827   CHOLHDL 3.9 11/03/2018 0827   LDLCALC 104 (H) 11/03/2018 0827   Recent Labs    11/03/18 0827  HGBA1C 5.2     Results for orders placed or performed in visit on 11/10/18  POCT Urinalysis Dipstick  Result Value Ref Range   Color, UA amber    Clarity, UA cloudy    Glucose, UA Negative Negative   Bilirubin, UA negative    Ketones, UA negative    Spec Grav, UA 1.010 1.010 - 1.025   Blood, UA trace    pH, UA 5.0 5.0 - 8.0   Protein, UA Negative Negative   Urobilinogen, UA 0.2 0.2 or 1.0 E.U./dL   Nitrite, UA positive    Leukocytes, UA Negative Negative   Appearance     Odor foul        Assessment & Plan:   Problem List Items Addressed This Visit    Colon polyps    Due for routine colon cancer screening - anticipate within 2-3 years before next colonoscopy - Deferred FIT Test today, advised to do Cologuard instead  - Discussion today about recommendations for either Colonoscopy or Cologuard screening, benefits and risks of screening, interested in Cologuard, understands that if positive then recommendation is for diagnostic colonoscopy to follow-up. - Ordered Cologuard today       Diverticulosis    Stable without flare up Prior chronic history episodic recurrent flares, improve typically with Cipro +/- Flagyl      Dyslipidemia    Stable, Controlled cholesterol on lifestyle Last lipid panel 10/2018 Calculated ASCVD 10 yr risk score 6-10% smoking risk  Plan: 1. Discussed option of Statin, will defer for now 2. Encourage improved lifestyle - low carb/cholesterol, reduce portion size,  continue improving regular exercise      Hormone replacement therapy (postmenopausal)    Stable, low dose HRT DIscussed tapering off med as a trial F/u if need      Overweight (BMI 25.0-29.9)    Improved weight loss >16 lbs Encouraged lifestyle and diet recommendations       Other Visit Diagnoses    Annual physical exam    -  Primary   Foul smelling urine       Relevant Orders   POCT Urinalysis Dipstick (Completed)   Urine Culture   Asymptomatic bacteriuria       Screening for breast cancer       Relevant Orders   MM DIGITAL SCREENING BILATERAL   Screening for colon cancer       Relevant Orders   Cologuard     Updated Health Maintenance information - Ordered Mammogram [facility] 2020 - Ordered Cologuard as above - Declined Prevnar, will return for vaccine, she is worried about immune response in setting of risk for COVID19 Reviewed recent lab results with patient Encouraged improvement to lifestyle with diet and exercise -Keep improving  Goal of weight loss   #Asymptomatic bacteruria - Clinically consistent with same problem - Not significant for UTI on history - UA is positive - Send Urine culture for documenting type of bacteria and resistances - HOLD antibiotics at this time unless she is symptomatic - Offered Urology refer in future, after COVID19 risk is reduced, she  may pursue Urodynamics  Orders Placed This Encounter  Procedures  . Urine Culture  . MM DIGITAL SCREENING BILATERAL    Standing Status:   Future    Standing Expiration Date:   01/10/2020    Order Specific Question:   Reason for Exam (SYMPTOM  OR DIAGNOSIS REQUIRED)    Answer:   Routine screening bilateral mammogram. May change to Bilateral MM Tomo screening if indicated and appropriate for patient/insurance    Order Specific Question:   Preferred imaging location?    Answer:   [facility]  . Cologuard  . POCT Urinalysis Dipstick      No orders of the defined types were placed in this  encounter.   Follow up plan: Return in about 1 year (around 11/10/2019) for Annual Physical.  Future labs to be ordered for [place] 11/10/2018, 9:16 AM

## 2018-11-10 NOTE — Assessment & Plan Note (Signed)
Stable, Controlled cholesterol on lifestyle Last lipid panel 10/2018 Calculated ASCVD 10 yr risk score 6-10% smoking risk  Plan: 1. Discussed option of Statin, will defer for now 2. Encourage improved lifestyle - low carb/cholesterol, reduce portion size, continue improving regular exercise

## 2018-11-10 NOTE — Assessment & Plan Note (Signed)
Improved weight loss >16 lbs Encouraged lifestyle and diet recommendations

## 2018-11-10 NOTE — Patient Instructions (Addendum)
Thank you for coming to the office today.  Try tapering down on Premarin, let me know.  Future Pneumonia vaccine when ready.  Urine culture - stay tuned, will release to mychart, check for sensitivities. If you develop problem let me know, future consider Urologist.  For Mammogram screening for breast cancer   Call the [facility] below anytime to schedule your own appointment now that order has been placed.  [facility] [place], [place] [address] Phone: [phone]   Colon Cancer Screening: - For all adults age 49+ routine colon cancer screening is highly recommended.     - Recent guidelines from [place] recommend starting age of 79 - Early detection of colon cancer is important, because often there are no warning signs or symptoms, also if found early usually it can be cured. Late stage is hard to treat.  - If you are not interested in Colonoscopy screening (if done and normal you could be cleared for 5 to 10 years until next due), then Cologuard is an excellent alternative for screening test for Colon Cancer. It is highly sensitive for detecting DNA of colon cancer from even the earliest stages. Also, there is NO bowel prep required. - If Cologuard is NEGATIVE, then it is good for 3 years before next due - If Cologuard is POSITIVE, then it is strongly advised to get a Colonoscopy, which allows the GI doctor to locate the source of the cancer or polyp (even very early stage) and treat it by removing it. ------------------------- If you would like to proceed with Cologuard (stool DNA test)  Follow instructions to collect sample, you may call the company for any help or questions, 24/7 telephone support at [phone].    DUE for FASTING BLOOD WORK (no food or drink after midnight before the lab appointment, only water or coffee without cream/sugar on the morning of)  SCHEDULE "Lab Only"  visit in the morning at the clinic for lab draw in 1 YEAR  - Make sure Lab Only appointment is at about 1 week before your next appointment, so that results will be available  For Lab Results, once available within 2-3 days of blood draw, you can can log in to MyChart online to view your results and a brief explanation. Also, we can discuss results at next follow-up visit.   Please schedule a Follow-up Appointment to: Return in about 1 year (around 11/10/2019) for Annual Physical.  If you have any other questions or concerns, please feel free to call the office or send a message through [facility]. You may also schedule an earlier appointment if necessary.  Additionally, you may be receiving a survey about your experience at our office within a few days to 1 week by e-mail or mail. We value your feedback.  Nobie Putnam, DO [place]

## 2018-11-10 NOTE — Assessment & Plan Note (Signed)
Stable without flare up Prior chronic history episodic recurrent flares, improve typically with Cipro +/- Flagyl

## 2018-11-13 LAB — URINE CULTURE
MICRO NUMBER:: 744545
SPECIMEN QUALITY:: ADEQUATE

## 2018-11-21 DIAGNOSIS — Z1211 Encounter for screening for malignant neoplasm of colon: Secondary | ICD-10-CM | POA: Diagnosis not present

## 2018-11-21 DIAGNOSIS — Z1212 Encounter for screening for malignant neoplasm of rectum: Secondary | ICD-10-CM | POA: Diagnosis not present

## 2018-11-22 LAB — COLOGUARD: Cologuard: NEGATIVE

## 2018-11-28 LAB — COLOGUARD: Cologuard: NEGATIVE

## 2018-11-29 ENCOUNTER — Telehealth: Payer: Self-pay

## 2018-12-02 NOTE — Telephone Encounter (Signed)
Open in error

## 2018-12-06 ENCOUNTER — Other Ambulatory Visit: Payer: Self-pay

## 2018-12-06 DIAGNOSIS — R6889 Other general symptoms and signs: Secondary | ICD-10-CM | POA: Diagnosis not present

## 2018-12-06 DIAGNOSIS — Z20822 Contact with and (suspected) exposure to covid-19: Secondary | ICD-10-CM

## 2018-12-07 LAB — NOVEL CORONAVIRUS, NAA: SARS-CoV-2, NAA: NOT DETECTED

## 2018-12-21 ENCOUNTER — Other Ambulatory Visit: Payer: Self-pay

## 2018-12-21 DIAGNOSIS — R6889 Other general symptoms and signs: Secondary | ICD-10-CM | POA: Diagnosis not present

## 2018-12-21 DIAGNOSIS — Z20822 Contact with and (suspected) exposure to covid-19: Secondary | ICD-10-CM

## 2018-12-22 LAB — NOVEL CORONAVIRUS, NAA: SARS-CoV-2, NAA: NOT DETECTED

## 2019-02-10 ENCOUNTER — Telehealth: Payer: Self-pay

## 2019-02-10 ENCOUNTER — Ambulatory Visit
Admission: RE | Admit: 2019-02-10 | Discharge: 2019-02-10 | Disposition: A | Discharge status: Home / Self Care | Payer: Medicare HMO | Source: Ambulatory Visit | Attending: Family Medicine | Admitting: Family Medicine

## 2019-02-10 DIAGNOSIS — Z1231 Encounter for screening mammogram for malignant neoplasm of breast: Secondary | ICD-10-CM | POA: Diagnosis not present

## 2019-02-10 DIAGNOSIS — Z1239 Encounter for other screening for malignant neoplasm of breast: Secondary | ICD-10-CM

## 2019-02-10 NOTE — Telephone Encounter (Signed)
Patient has mammogram today and they told her that she needs further imaging for left breast mass.  Please order diagnostic mammogram to advise patient.

## 2019-02-13 ENCOUNTER — Other Ambulatory Visit: Payer: Self-pay | Admitting: Family Medicine

## 2019-02-13 DIAGNOSIS — R928 Other abnormal and inconclusive findings on diagnostic imaging of breast: Secondary | ICD-10-CM

## 2019-02-13 DIAGNOSIS — N6320 Unspecified lump in the left breast, unspecified quadrant: Secondary | ICD-10-CM

## 2019-02-13 NOTE — Telephone Encounter (Signed)
Spoke with [facility] and the orders have already been sent and signed my Dr. Raliegh Ip and they will contact patient.  I also notified patient of plan.

## 2019-02-17 ENCOUNTER — Ambulatory Visit
Admission: RE | Admit: 2019-02-17 | Discharge: 2019-02-17 | Disposition: A | Discharge status: Home / Self Care | Payer: Medicare HMO | Source: Ambulatory Visit | Attending: Family Medicine | Admitting: Family Medicine

## 2019-02-17 DIAGNOSIS — N6320 Unspecified lump in the left breast, unspecified quadrant: Secondary | ICD-10-CM | POA: Diagnosis not present

## 2019-02-17 DIAGNOSIS — R928 Other abnormal and inconclusive findings on diagnostic imaging of breast: Secondary | ICD-10-CM

## 2019-02-17 DIAGNOSIS — N6002 Solitary cyst of left breast: Secondary | ICD-10-CM | POA: Diagnosis not present

## 2019-04-05 ENCOUNTER — Telehealth: Payer: Self-pay

## 2019-04-05 DIAGNOSIS — Z87891 Personal history of nicotine dependence: Secondary | ICD-10-CM

## 2019-04-05 NOTE — Telephone Encounter (Signed)
Patient states that she is interested in scheduling her annual lung cancer screening CT scan on the same day as her husband (please see note in [facility] chart, DOB 04/15[birth year]47) She states that mornings work best.  Confirmed that patient is within the appropriate age range, and asymptomatic, (no signs or symptoms of lung cancer). Patient denies illness that would prevent curative treatment for lung cancer if found. Verified smoking history (1/2-3/4 ppd)   Please call her cell to schedule both her and her husband: [phone]  Patient is also interested in the smoking cessation program.

## 2019-04-06 NOTE — Addendum Note (Signed)
Addended by: Lieutenant Diego on: 04/06/2019 10:49 AM   Modules accepted: Orders

## 2019-04-06 NOTE — Telephone Encounter (Signed)
Smoking history: current, 36 pack year 

## 2019-04-07 HISTORY — PX: DENTAL SURGERY: SHX609

## 2019-04-12 ENCOUNTER — Ambulatory Visit
Admission: RE | Admit: 2019-04-12 | Discharge: 2019-04-12 | Disposition: A | Discharge status: Home / Self Care | Payer: Medicare HMO | Source: Ambulatory Visit | Attending: Nurse Practitioner | Admitting: Nurse Practitioner

## 2019-04-12 ENCOUNTER — Other Ambulatory Visit: Payer: Self-pay

## 2019-04-12 DIAGNOSIS — Z87891 Personal history of nicotine dependence: Secondary | ICD-10-CM | POA: Insufficient documentation

## 2019-04-12 DIAGNOSIS — F17210 Nicotine dependence, cigarettes, uncomplicated: Secondary | ICD-10-CM | POA: Diagnosis not present

## 2019-04-14 ENCOUNTER — Encounter: Payer: Self-pay | Admitting: *Deleted

## 2019-06-29 ENCOUNTER — Ambulatory Visit (INDEPENDENT_AMBULATORY_CARE_PROVIDER_SITE_OTHER): Payer: Medicare HMO | Admitting: Family Medicine

## 2019-06-29 ENCOUNTER — Encounter: Payer: Self-pay | Admitting: Family Medicine

## 2019-06-29 ENCOUNTER — Other Ambulatory Visit: Payer: Self-pay

## 2019-06-29 DIAGNOSIS — K5792 Diverticulitis of intestine, part unspecified, without perforation or abscess without bleeding: Secondary | ICD-10-CM

## 2019-06-29 MED ORDER — CIPROFLOXACIN HCL 500 MG PO TABS: 500.0000 mg | ORAL_TABLET | Freq: Two times a day (BID) | ORAL | 0 refills | Status: DC

## 2019-06-29 MED ORDER — METRONIDAZOLE 500 MG PO TABS: 500.0000 mg | ORAL_TABLET | Freq: Three times a day (TID) | ORAL | 0 refills | Status: DC

## 2019-06-29 NOTE — Progress Notes (Signed)
Virtual Visit via Telephone The purpose of this virtual visit is to provide medical care while limiting exposure to the novel coronavirus (COVID19) for both patient and office staff.  Consent was obtained for phone visit:  Yes.   Answered questions that patient had about telehealth interaction:  Yes.   I discussed the limitations, risks, security and privacy concerns of performing an evaluation and management service by telephone. I also discussed with the patient that there may be a patient responsible charge related to this service. The patient expressed understanding and agreed to proceed.  Patient Location: Home Provider Location: [facility])  ---------------------------------------------------------------------- Chief Complaint  Patient presents with  . Constipation    constipation onset 4 days, gastric pain onset yesterday, back pain, headache, nausea, low grade fever denies Covid Sxs onset week as per patient Sxs are similar when she was diagnoased with diverticulitis on 06/2018    S: Reviewed CMA documentation. I have called patient and gathered additional HPI as follows:  PRESUMED DIVERTICULITIS CONSTIPATION / ABDOMINAL PAIN Reports that symptoms started 3-4 days ago has had difficulty with bowel movements, had constipation, increased gas production and bubbling or gurgling belching and low back pain. Also dull intermittent headache. - Today she does feel a little better overall but still has abdominal pain episodic worse with activity at times. - Prior history of diverticulitis 06/29/18 last year, same issue, treated with Cipro at that time with good results. She admits that she has eaten some [place] stew again and some peanuts / salad and other things that can trigger symptoms Admits constipation similar to prior, she never started the Miralax - Admits some fatigue - Last night felt a little feverish temp 72F, usually runs lower temp. Worse at  night  Denies any high risk travel to areas of current concern for COVID19. Denies any known or suspected exposure to person with or possibly with COVID19.  Denies any chills, sweats, body ache, cough, shortness of breath, sinus pain or pressure, diarrhea  Past Medical History:  Diagnosis Date  . Diverticulosis    Social History   Tobacco Use  . Smoking status: Current Every Day Smoker    Packs/day: 0.75    Years: 47.00    Pack years: 35.25    Types: Cigarettes  . Smokeless tobacco: Current User  Substance Use Topics  . Alcohol use: Never  . Drug use: Never    Current Outpatient Medications:  .  fexofenadine-pseudoephedrine (ALLEGRA-D 24) 180-240 MG 24 hr tablet, Take 1 tablet by mouth daily., Disp: , Rfl:  .  Probiotic Product (ALIGN PO), Take by mouth. Take probiotic daily, Disp: , Rfl:  .  ciprofloxacin (CIPRO) 500 MG tablet, Take 1 tablet (500 mg total) by mouth 2 (two) times daily. One po bid x 7 days, Disp: 14 tablet, Rfl: 0 .  estradiol (ESTRACE) 0.5 MG tablet, Take 0.5 mg by mouth daily. , Disp: , Rfl: 3 .  metroNIDAZOLE (FLAGYL) 500 MG tablet, Take 1 tablet (500 mg total) by mouth 3 (three) times daily. Do not drink alcohol while taking this medicine., Disp: 21 tablet, Rfl: 0  Depression screen [facility] 2/9 11/10/2018 06/29/2018 08/11/2017  Decreased Interest 0 0 0  Down, Depressed, Hopeless 0 0 0  PHQ - 2 Score 0 0 0    No flowsheet data found.  -------------------------------------------------------------------------- O: No physical exam performed due to remote telephone encounter.  Lab results reviewed.  No results found for this or any previous visit (from the past  2160 hour(s)).  -------------------------------------------------------------------------- A&P:  Problem List Items Addressed This Visit    None    Visit Diagnoses    Diverticulitis    -  Primary   Relevant Medications   ciprofloxacin (CIPRO) 500 MG tablet   metroNIDAZOLE (FLAGYL) 500 MG tablet      Clinically consistent with acute LLQ abdominal pain, similar to prior diverticulitis - with known triggers Last flare 06/29/18, nearly same responded with Cipro and Flagyl that time Tolerating PO and no concern of dehydration based on her intake  Plan Start empiric treatment with antibiotics - Cipro 500mg  BID x 7 days + Flagyl 500 TID x 7 days - START miralax for constipation, as previously advised, counseling on dosing, can use acute and or maintenance - Keep improving hydration - Follow-up within 1 week as needed Strict return criteria given when to go to hospital if not improving   Meds ordered this encounter  Medications  . ciprofloxacin (CIPRO) 500 MG tablet    Sig: Take 1 tablet (500 mg total) by mouth 2 (two) times daily. One po bid x 7 days    Dispense:  14 tablet    Refill:  0  . metroNIDAZOLE (FLAGYL) 500 MG tablet    Sig: Take 1 tablet (500 mg total) by mouth 3 (three) times daily. Do not drink alcohol while taking this medicine.    Dispense:  21 tablet    Refill:  0    Follow-up: - Return in 1 week or sooner if not improving  Patient verbalizes understanding with the above medical recommendations including the limitation of remote medical advice.  Specific follow-up and call-back criteria were given for patient to follow-up or seek medical care more urgently if needed.   - Time spent in direct consultation with patient on phone: 9 minutes   Nobie Putnam, [facility] 06/29/2019, 11:14 AM

## 2019-06-29 NOTE — Patient Instructions (Addendum)
Start Cipro and Flagyl antibiotic  For Constipation (less frequent bowel movement that can be hard dry or involve straining).  Recommend trying OTC Miralax 17g = 1 capful in large glass water once daily for now, try several days to see if working, goal is soft stool or BM 1-2 times daily, if too loose then reduce dose or try every other day. If not effective may need to increase it to 2 doses at once in AM or may do 1 in morning and 1 in afternoon/evening  - This medicine is very safe and can be used often without any problem and will not make you dehydrated. It is good for use on AS NEEDED BASIS or even MAINTENANCE therapy for longer term for several days to weeks at a time to help regulate bowel movements  Other more natural remedies or preventative treatment: - Increase hydration with water - Increase fiber in diet (high fiber foods = vegetables, leafy greens, oats/grains) - May take OTC Fiber supplement (metamucil powder or pill/gummy) - May try OTC Probiotic  We can refer to GI for colonoscopy THIS year if you get another flare, or next year - keep me posted  Please schedule a Follow-up Appointment to: Return in about 1 week (around 07/06/2019), or if symptoms worsen or fail to improve, for diverticulitis / abdominal pain.  If you have any other questions or concerns, please feel free to call the office or send a message through [place]. You may also schedule an earlier appointment if necessary.  Additionally, you may be receiving a survey about your experience at our office within a few days to 1 week by e-mail or mail. We value your feedback.  Nobie Putnam, DO [place]

## 2019-07-11 ENCOUNTER — Ambulatory Visit: Payer: Medicare HMO

## 2019-07-11 ENCOUNTER — Ambulatory Visit (INDEPENDENT_AMBULATORY_CARE_PROVIDER_SITE_OTHER): Payer: Medicare HMO

## 2019-07-11 VITALS — Ht 68.0 in | Wt 172.0 lb

## 2019-07-11 DIAGNOSIS — Z0000 Encounter for general adult medical examination without abnormal findings: Secondary | ICD-10-CM | POA: Diagnosis not present

## 2019-07-11 NOTE — Patient Instructions (Signed)
Ms. Julie Pena , Thank you for taking time to come for your Medicare Wellness Visit. I appreciate your ongoing commitment to your health goals. Please review the following plan we discussed and let me know if I can assist you in the future.   Screening recommendations/referrals: Colonoscopy: cologuard completed 2018 Mammogram: up to date  Bone Density: due now, can get with next mammogram  Recommended yearly ophthalmology/optometry visit for glaucoma screening and checkup Recommended yearly dental visit for hygiene and checkup  Vaccinations: Influenza vaccine: up to date  Pneumococcal vaccine: due now  Tdap vaccine: up to date  Shingles vaccine: shingrix eligible    Covid-19: declined   Advanced directives: Please bring a copy of your health care power of attorney and living will to the office at your convenience.  Conditions/risks identified: none   Next appointment: Follow up in one year for your annual wellness visit   Preventive Care 65 Years and Older, Female Preventive care refers to lifestyle choices and visits with your health care provider that can promote health and wellness. What does preventive care include?  A yearly physical exam. This is also called an annual well check.  Dental exams once or twice a year.  Routine eye exams. Ask your health care provider how often you should have your eyes checked.  Personal lifestyle choices, including:  Daily care of your teeth and gums.  Regular physical activity.  Eating a healthy diet.  Avoiding tobacco and drug use.  Limiting alcohol use.  Practicing safe sex.  Taking low-dose aspirin every day.  Taking vitamin and mineral supplements as recommended by your health care provider. What happens during an annual well check? The services and screenings done by your health care provider during your annual well check will depend on your age, overall health, lifestyle risk factors, and family history of  disease. Counseling  Your health care provider may ask you questions about your:  Alcohol use.  Tobacco use.  Drug use.  Emotional well-being.  Home and relationship well-being.  Sexual activity.  Eating habits.  History of falls.  Memory and ability to understand (cognition).  Work and work Statistician.  Reproductive health. Screening  You may have the following tests or measurements:  Height, weight, and BMI.  Blood pressure.  Lipid and cholesterol levels. These may be checked every 5 years, or more frequently if you are over 58 years old.  Skin check.  Lung cancer screening. You may have this screening every year starting at age 17 if you have a 30-pack-year history of smoking and currently smoke or have quit within the past 15 years.  Fecal occult blood test (FOBT) of the stool. You may have this test every year starting at age 69.  Flexible sigmoidoscopy or colonoscopy. You may have a sigmoidoscopy every 5 years or a colonoscopy every 10 years starting at age 39.  Hepatitis C blood test.  Hepatitis B blood test.  Sexually transmitted disease (STD) testing.  Diabetes screening. This is done by checking your blood sugar (glucose) after you have not eaten for a while (fasting). You may have this done every 1-3 years.  Bone density scan. This is done to screen for osteoporosis. You may have this done starting at age 51.  Mammogram. This may be done every 1-2 years. Talk to your health care provider about how often you should have regular mammograms. Talk with your health care provider about your test results, treatment options, and if necessary, the need for more tests. Vaccines  Your health care provider may recommend certain vaccines, such as:  Influenza vaccine. This is recommended every year.  Tetanus, diphtheria, and acellular pertussis (Tdap, Td) vaccine. You may need a Td booster every 10 years.  Zoster vaccine. You may need this after age  74.  Pneumococcal 13-valent conjugate (PCV13) vaccine. One dose is recommended after age 19.  Pneumococcal polysaccharide (PPSV23) vaccine. One dose is recommended after age 2. Talk to your health care provider about which screenings and vaccines you need and how often you need them. This information is not intended to replace advice given to you by your health care provider. Make sure you discuss any questions you have with your health care provider. Document Released: 04/19/2015 Document Revised: 12/11/2015 Document Reviewed: 01/22/2015 Elsevier Interactive Patient Education  2017 [employer] Prevention in the Home Falls can cause injuries. They can happen to people of all ages. There are many things you can do to make your home safe and to help prevent falls. What can I do on the outside of my home?  Regularly fix the edges of walkways and driveways and fix any cracks.  Remove anything that might make you trip as you walk through a door, such as a raised step or threshold.  Trim any bushes or trees on the path to your home.  Use bright outdoor lighting.  Clear any walking paths of anything that might make someone trip, such as rocks or tools.  Regularly check to see if handrails are loose or broken. Make sure that both sides of any steps have handrails.  Any raised decks and porches should have guardrails on the edges.  Have any leaves, snow, or ice cleared regularly.  Use sand or salt on walking paths during winter.  Clean up any spills in your garage right away. This includes oil or grease spills. What can I do in the bathroom?  Use night lights.  Install grab bars by the toilet and in the tub and shower. Do not use towel bars as grab bars.  Use non-skid mats or decals in the tub or shower.  If you need to sit down in the shower, use a plastic, non-slip stool.  Keep the floor dry. Clean up any water that spills on the floor as soon as it happens.  Remove  soap buildup in the tub or shower regularly.  Attach bath mats securely with double-sided non-slip rug tape.  Do not have throw rugs and other things on the floor that can make you trip. What can I do in the bedroom?  Use night lights.  Make sure that you have a light by your bed that is easy to reach.  Do not use any sheets or blankets that are too big for your bed. They should not hang down onto the floor.  Have a firm chair that has side arms. You can use this for support while you get dressed.  Do not have throw rugs and other things on the floor that can make you trip. What can I do in the kitchen?  Clean up any spills right away.  Avoid walking on wet floors.  Keep items that you use a lot in easy-to-reach places.  If you need to reach something above you, use a strong step stool that has a grab bar.  Keep electrical cords out of the way.  Do not use floor polish or wax that makes floors slippery. If you must use wax, use non-skid floor wax.  Do  not have throw rugs and other things on the floor that can make you trip. What can I do with my stairs?  Do not leave any items on the stairs.  Make sure that there are handrails on both sides of the stairs and use them. Fix handrails that are broken or loose. Make sure that handrails are as long as the stairways.  Check any carpeting to make sure that it is firmly attached to the stairs. Fix any carpet that is loose or worn.  Avoid having throw rugs at the top or bottom of the stairs. If you do have throw rugs, attach them to the floor with carpet tape.  Make sure that you have a light switch at the top of the stairs and the bottom of the stairs. If you do not have them, ask someone to add them for you. What else can I do to help prevent falls?  Wear shoes that:  Do not have high heels.  Have rubber bottoms.  Are comfortable and fit you well.  Are closed at the toe. Do not wear sandals.  If you use a  stepladder:  Make sure that it is fully opened. Do not climb a closed stepladder.  Make sure that both sides of the stepladder are locked into place.  Ask someone to hold it for you, if possible.  Clearly mark and make sure that you can see:  Any grab bars or handrails.  First and last steps.  Where the edge of each step is.  Use tools that help you move around (mobility aids) if they are needed. These include:  Canes.  Walkers.  Scooters.  Crutches.  Turn on the lights when you go into a dark area. Replace any light bulbs as soon as they burn out.  Set up your furniture so you have a clear path. Avoid moving your furniture around.  If any of your floors are uneven, fix them.  If there are any pets around you, be aware of where they are.  Review your medicines with your doctor. Some medicines can make you feel dizzy. This can increase your chance of falling. Ask your doctor what other things that you can do to help prevent falls. This information is not intended to replace advice given to you by your health care provider. Make sure you discuss any questions you have with your health care provider. Document Released: 01/17/2009 Document Revised: 08/29/2015 Document Reviewed: 04/27/2014 Elsevier Interactive Patient Education  2017 [employer].

## 2019-07-11 NOTE — Progress Notes (Signed)
Subjective:   Julie Pena is a 69 y.o. female who presents for an Initial Medicare Annual Wellness Visit.  This visit is being conducted via phone call  - after an attmept to do on video chat - due to the COVID-19 pandemic. This patient has given me verbal consent via phone to conduct this visit, patient states they are participating from their home address. Some vital signs may be absent or patient reported.   Patient identification: identified by name, DOB, and current address.    Review of Systems      Cardiac Risk Factors include: advanced age (>80men, >2 women)     Objective:    Today's Vitals   07/11/19 1521  Weight: 172 lb (78 kg)  Height: 5\' 8"  (1.727 m)   Body mass index is 26.15 kg/m.  Advanced Directives 07/11/2019  Does Patient Have a Medical Advance Directive? Yes  Type of Advance Directive Living will;Healthcare Power of [place] in Chart? No - copy requested    Current Medications (verified) Outpatient Encounter Medications as of 07/11/2019  Medication Sig  . Polyethylene Glycol 3350 (MIRALAX PO) Take by mouth.  . Probiotic Product (ALIGN PO) Take by mouth. Take probiotic daily  . fexofenadine-pseudoephedrine (ALLEGRA-D 24) 180-240 MG 24 hr tablet Take 1 tablet by mouth daily.  . [DISCONTINUED] ciprofloxacin (CIPRO) 500 MG tablet Take 1 tablet (500 mg total) by mouth 2 (two) times daily. One po bid x 7 days (Patient not taking: Reported on 07/11/2019)  . [DISCONTINUED] estradiol (ESTRACE) 0.5 MG tablet Take 0.5 mg by mouth daily.   . [DISCONTINUED] metroNIDAZOLE (FLAGYL) 500 MG tablet Take 1 tablet (500 mg total) by mouth 3 (three) times daily. Do not drink alcohol while taking this medicine. (Patient not taking: Reported on 07/11/2019)   No facility-administered encounter medications on file as of 07/11/2019.    Allergies (verified) Codeine   History: Past Medical History:  Diagnosis Date  . Diverticulosis    Past  Surgical History:  Procedure Laterality Date  . ABDOMINAL HYSTERECTOMY  1988   Initial partial 1978, then repeat TOTAL 1988  . BREAST EXCISIONAL BIOPSY Right mid 1980s   benign  . CATARACT EXTRACTION Bilateral 2018  . DENTAL SURGERY  04/2019  . OOPHORECTOMY    . TONSILLECTOMY Bilateral 1963   Family History  Problem Relation Age of Onset  . Cancer Mother        kidney  . Kidney cancer Mother   . Cancer Father        lung  . Alcohol abuse Father   . Depression Father   . Lung cancer Father   . Colon cancer Neg Hx   . Breast cancer Neg Hx    Social History   Socioeconomic History  . Marital status: Married    Spouse name: Kaylann Behner  . Number of children: Not on file  . Years of education: Not on file  . Highest education level: Not on file  Occupational History  . Occupation: retired  Tobacco Use  . Smoking status: Current Every Day Smoker    Packs/day: 0.50    Years: 47.00    Pack years: 23.50    Types: Cigarettes  . Smokeless tobacco: Never Used  . Tobacco comment: enrolled in smoking cessation class starting 08/2019  Substance and Sexual Activity  . Alcohol use: Never  . Drug use: Never  . Sexual activity: Not on file  Other Topics Concern  . Not  on file  Social History Narrative  . Not on file   Social Determinants of Health   Financial Resource Strain:   . Difficulty of Paying Living Expenses:   Food Insecurity:   . Worried About Charity fundraiser in the Last Year:   . Arboriculturist in the Last Year:   Transportation Needs:   . Film/video editor (Medical):   Marland Kitchen Lack of Transportation (Non-Medical):   Physical Activity:   . Days of Exercise per Week:   . Minutes of Exercise per Session:   Stress:   . Feeling of Stress :   Social Connections:   . Frequency of Communication with Friends and Family:   . Frequency of Social Gatherings with Friends and Family:   . Attends Religious Services:   . Active Member of Clubs or Organizations:     . Attends Archivist Meetings:   Marland Kitchen Marital Status:     Tobacco Counseling Ready to quit: Yes Counseling given: Yes Comment: enrolled in smoking cessation class starting 08/2019   Clinical Intake:                        Activities of Daily Living In your present state of health, do you have any difficulty performing the following activities: 07/11/2019 06/29/2019  Hearing? N N  Comment no hearing aids -  Vision? N N  Comment reading glasses, Dr.Woodard as needed -  Difficulty concentrating or making decisions? N N  Walking or climbing stairs? N N  Dressing or bathing? N N  Doing errands, shopping? N N  Preparing Food and eating ? N -  Using the Toilet? N -  In the past six months, have you accidently leaked urine? N -  Do you have problems with loss of bowel control? N -  Managing your Medications? N -  Managing your Finances? N -  Housekeeping or managing your Housekeeping? N -  Some recent data might be hidden     Immunizations and Health Maintenance Immunization History  Administered Date(s) Administered  . Fluad Quad(high Dose 65+) 01/19/2019  . Influenza, High Dose Seasonal PF 01/22/2017  . Influenza-Unspecified 02/05/2018  . Tdap 08/25/2017   Health Maintenance Due  Topic Date Due  . DEXA SCAN  Never done  . PNA vac Low Risk Adult (1 of 2 - PCV13) Never done    Patient Care Team: Olin Hauser, DO as PCP - General (Family Medicine)  Indicate any recent Medical Services you may have received from other than [facility] providers in the past year (date may be approximate).     Assessment:   This is a routine wellness examination for [name].  Hearing/Vision screen  Hearing Screening   125Hz  250Hz  500Hz  1000Hz  2000Hz  3000Hz  4000Hz  6000Hz  8000Hz   Right ear:           Left ear:           Vision Screening Comments: Dr.Woodard as needed  Dietary issues and exercise activities discussed: Current Exercise Habits: The patient does not  participate in regular exercise at present(daily acitivities), Exercise limited by: None identified  Goals Addressed   None    Depression Screen PHQ 2/9 Scores 07/11/2019 11/10/2018 06/29/2018 08/11/2017  PHQ - 2 Score 0 0 0 0    Fall Risk Fall Risk  07/11/2019 11/10/2018 06/29/2018 02/25/2018 08/11/2017  Falls in the past year? 0 0 0 0 No  Number falls in past yr: 0 - - - -  Injury with Fall? 0 - - - -  Follow up - Falls evaluation completed Falls evaluation completed Falls evaluation completed -   FALL RISK PREVENTION PERTAINING TO THE HOME:  Any stairs in or around the home? Yes  If so, are there any without handrails? No   Home free of loose throw rugs in walkways, pet beds, electrical cords, etc? Yes  Adequate lighting in your home to reduce risk of falls? Yes   ASSISTIVE DEVICES UTILIZED TO PREVENT FALLS:  Life alert? No  Use of a cane, walker or w/c? No  Grab bars in the bathroom? No  Shower chair or bench in shower? No  Elevated toilet seat or a handicapped toilet? No    DME ORDERS:  DME order needed?  No   TIMED UP AND GO:  Unable to perform     Cognitive Function:        Screening Tests Health Maintenance  Topic Date Due  . DEXA SCAN  Never done  . PNA vac Low Risk Adult (1 of 2 - PCV13) Never done  . INFLUENZA VACCINE  11/05/2019  . MAMMOGRAM  02/09/2021  . Fecal DNA (Cologuard)  11/21/2021  . TETANUS/TDAP  08/26/2027  . Hepatitis C Screening  Completed    Qualifies for Shingles Vaccine? Yes  Zostavax completed n/a. Due for Shingrix. Education has been provided regarding the importance of this vaccine. Pt has been advised to call insurance company to determine out of pocket expense. Advised may also receive vaccine at local pharmacy or Health Dept. Verbalized acceptance and understanding.  Tdap: up to date   Flu Vaccine: up to date   Pneumococcal Vaccine: due now, will get at next visit.   Covid-19 Vaccine : declined.   Cancer  Screenings:  Colorectal Screening: Completed cologuard 11/22/2018, due 2023  Mammogram: Completed 02/10/2019. Repeat every year.  Bone Density: ordered  Lung Cancer Screening: (Low Dose CT Chest recommended if Age 6-80 years, 30 pack-year currently smoking OR have quit w/in 15years.) does qualify.   Last completed 04/12/2019  Additional Screening:  Hepatitis C Screening: does qualify; Completed 2019  Vision Screening: Recommended annual ophthalmology exams for early detection of glaucoma and other disorders of the eye. Is the patient up to date with their annual eye exam?  Yes  Who is the provider or what is the name of the office in which the pt attends annual eye exams? Dr.Woodard    Dental Screening: Recommended annual dental exams for proper oral hygiene  Community Resource Referral:  CRR required this visit?  No      Plan:  I have personally reviewed and addressed the Medicare Annual Wellness questionnaire and have noted the following in the patient's chart:  A. Medical and social history B. Use of alcohol, tobacco or illicit drugs  C. Current medications and supplements D. Functional ability and status E.  Nutritional status F.  Physical activity G. Advance directives H. List of other physicians I.  Hospitalizations, surgeries, and ER visits in previous 12 months J.  [place] such as hearing and vision if needed, cognitive and depression L. Referrals and appointments   In addition, I have reviewed and discussed with patient certain preventive protocols, quality metrics, and best practice recommendations. A written personalized care plan for preventive services as well as general preventive health recommendations were provided to patient.   Signed,    Bevelyn Ngo, LPN   [id number]  Nurse Health Advisor   Nurse Notes: none

## 2019-10-24 DIAGNOSIS — H353131 Nonexudative age-related macular degeneration, bilateral, early dry stage: Secondary | ICD-10-CM | POA: Diagnosis not present

## 2019-10-24 DIAGNOSIS — H26493 Other secondary cataract, bilateral: Secondary | ICD-10-CM | POA: Diagnosis not present

## 2019-11-27 ENCOUNTER — Other Ambulatory Visit: Payer: Self-pay

## 2019-11-27 ENCOUNTER — Encounter: Payer: Self-pay | Admitting: Family Medicine

## 2019-11-27 ENCOUNTER — Ambulatory Visit (INDEPENDENT_AMBULATORY_CARE_PROVIDER_SITE_OTHER): Payer: Medicare HMO | Admitting: Family Medicine

## 2019-11-27 VITALS — Temp 98.8°F

## 2019-11-27 DIAGNOSIS — K5792 Diverticulitis of intestine, part unspecified, without perforation or abscess without bleeding: Secondary | ICD-10-CM | POA: Insufficient documentation

## 2019-11-27 MED ORDER — CIPROFLOXACIN HCL 500 MG PO TABS: 500.0000 mg | ORAL_TABLET | Freq: Two times a day (BID) | ORAL | 0 refills | Status: AC

## 2019-11-27 NOTE — Patient Instructions (Signed)
I have sent in a prescription for Ciprofloxacin 500mg , to take 1 tablet 2x per day for the next 7 days.  Be sure to increase your hydration.  If you have any worsening of symptoms, bloody stool, worsening abdominal cramping, fevers, nausea, vomiting, diarrhea, to PROCEED TO [place]!  We will plan to see you back if your symptoms worsen or fail to improve  You will receive a survey after today's visit either digitally by e-mail or paper by USPS mail. Your experiences and feedback matter to [place].  Please respond so we know how we are doing as we provide care for you.  Call us with any questions/concerns/needs.  It is my goal to be available to you for your health concerns.  Thanks for choosing me to be a partner in your healthcare needs!  Harlin Rain, FNP-C Family Nurse Practitioner [facility] Phone: [phone]

## 2019-11-27 NOTE — Progress Notes (Signed)
Virtual Visit via Telephone  The purpose of this virtual visit is to provide medical care while limiting exposure to the novel coronavirus (COVID19) for both patient and office staff.  Consent was obtained for phone visit:  Yes.   Answered questions that patient had about telehealth interaction:  Yes.   I discussed the limitations, risks, security and privacy concerns of performing an evaluation and management service by telephone. I also discussed with the patient that there may be a patient responsible charge related to this service. The patient expressed understanding and agreed to proceed.  Patient is at home and is accessed via telephone Services are provided by Harlin Rain, FNP-C from [facility])  ---------------------------------------------------------------------- Chief Complaint  Patient presents with  . Abdominal Cramping    pt state she feel like she got a big gas bubble in the lower mid abdomen that radiated over the left side of addomen. Tenderness to he touch and with movement. Pt has a history of Diverticulitis. dull headache w/ fever .     S: Reviewed CMA documentation. I have called patient and gathered additional HPI as follows:  Julie Pena presents for telemedicine visit today, reports that she has had a one day onset of abdominal pain that is tender to the touch in the LLQ and with movement.  Has a reported history of diverticulitis with flares approximately 1-2x per year.  Reports this feels similar and when she is able to catch this early she typically has symptom resolution with taking ciprofloxacin.  Denies fevers, vomiting, diarrhea, chills, shakes.  Denies any changes in her diet as of lately.  Patient is currently home Denies any high risk travel to areas of current concern for COVID19. Denies any known or suspected exposure to person with or possibly with COVID19.  Past Medical History:  Diagnosis Date  . Diverticulosis     Social History   Tobacco Use  . Smoking status: Current Every Day Smoker    Packs/day: 0.50    Years: 47.00    Pack years: 23.50    Types: Cigarettes  . Smokeless tobacco: Never Used  . Tobacco comment: enrolled in smoking cessation class starting 08/2019  Vaping Use  . Vaping Use: Never used  Substance Use Topics  . Alcohol use: Never  . Drug use: Never    Current Outpatient Medications:  .  fexofenadine-pseudoephedrine (ALLEGRA-D 24) 180-240 MG 24 hr tablet, Take 1 tablet by mouth daily., Disp: , Rfl:  .  Polyethylene Glycol 3350 (MIRALAX PO), Take by mouth., Disp: , Rfl:  .  Probiotic Product (ALIGN PO), Take by mouth. Take probiotic daily, Disp: , Rfl:  .  ciprofloxacin (CIPRO) 500 MG tablet, Take 1 tablet (500 mg total) by mouth 2 (two) times daily for 7 days., Disp: 14 tablet, Rfl: 0  Depression screen [facility] 2/9 07/11/2019 11/10/2018 06/29/2018  Decreased Interest 0 0 0  Down, Depressed, Hopeless 0 0 0  PHQ - 2 Score 0 0 0    No flowsheet data found.  -------------------------------------------------------------------------- O: No physical exam performed due to remote telephone encounter.  Physical Exam: Patient remotely monitored without video.  Verbal communication appropriate.  Cognition normal.  Lab results reviewed.  No results found for this or any previous visit (from the past 2160 hour(s)).  -------------------------------------------------------------------------- A&P:  Problem List Items Addressed This Visit      Other   Diverticulitis - Primary    Clinically consistent with acute LLQ abdominal pain, similar to prior diverticulitis -  with known triggers Last flare 06/29/19, nearly same responded with Cipro and Flagyl that time.  Reports when she catches this early, she typically responds with just Cipro. Tolerating PO and no concern of dehydration based on her intake  Plan Start empiric treatment with antibiotics - Cipro 500mg  BID x 7 days - Keep  improving hydration - Follow-up within 1 week as needed Strict return criteria given when to go to hospital if not improving      Relevant Medications   ciprofloxacin (CIPRO) 500 MG tablet      Meds ordered this encounter  Medications  . ciprofloxacin (CIPRO) 500 MG tablet    Sig: Take 1 tablet (500 mg total) by mouth 2 (two) times daily for 7 days.    Dispense:  14 tablet    Refill:  0    Follow-up: - Return if symptoms worsen or fail to improve  Patient verbalizes understanding with the above medical recommendations including the limitation of remote medical advice.  Specific follow-up and call-back criteria were given for patient to follow-up or seek medical care more urgently if needed.  - Time spent in direct consultation with patient on phone: 7 minutes  Harlin Rain, [facility] 11/27/2019, 4:49 PM

## 2019-11-27 NOTE — Assessment & Plan Note (Signed)
Clinically consistent with acute LLQ abdominal pain, similar to prior diverticulitis - with known triggers Last flare 06/29/19, nearly same responded with Cipro and Flagyl that time.  Reports when she catches this early, she typically responds with just Cipro. Tolerating PO and no concern of dehydration based on her intake  Plan Start empiric treatment with antibiotics - Cipro 500mg  BID x 7 days - Keep improving hydration - Follow-up within 1 week as needed Strict return criteria given when to go to hospital if not improving

## 2019-12-04 DIAGNOSIS — H1045 Other chronic allergic conjunctivitis: Secondary | ICD-10-CM | POA: Diagnosis not present

## 2019-12-04 DIAGNOSIS — H26492 Other secondary cataract, left eye: Secondary | ICD-10-CM | POA: Diagnosis not present

## 2019-12-04 DIAGNOSIS — H35363 Drusen (degenerative) of macula, bilateral: Secondary | ICD-10-CM | POA: Diagnosis not present

## 2019-12-04 DIAGNOSIS — Z961 Presence of intraocular lens: Secondary | ICD-10-CM | POA: Diagnosis not present

## 2019-12-04 DIAGNOSIS — H26493 Other secondary cataract, bilateral: Secondary | ICD-10-CM | POA: Diagnosis not present

## 2019-12-20 DIAGNOSIS — Z20822 Contact with and (suspected) exposure to covid-19: Secondary | ICD-10-CM | POA: Diagnosis not present

## 2020-02-01 ENCOUNTER — Telehealth: Payer: Self-pay | Admitting: Family Medicine

## 2020-02-01 DIAGNOSIS — K5792 Diverticulitis of intestine, part unspecified, without perforation or abscess without bleeding: Secondary | ICD-10-CM

## 2020-02-01 MED ORDER — CIPROFLOXACIN HCL 500 MG PO TABS: 500.0000 mg | ORAL_TABLET | Freq: Two times a day (BID) | ORAL | 0 refills | Status: DC

## 2020-02-01 NOTE — Telephone Encounter (Signed)
Pt. said that she was having another flare up wanted to know if you would call in something before the weekend (cipro) before her appt. Nov 3rd  At 2:40

## 2020-02-01 NOTE — Telephone Encounter (Signed)
Ok. I have printed Cipro rx - it is signed and ready for her to pick up if she wants to pick up the paper rx and take it to other pharmacy if that is easier while I will be out of office  Nobie Putnam, [facility] 02/01/2020, 1:52 PM

## 2020-02-01 NOTE — Addendum Note (Signed)
Addended by: Olin Hauser on: 02/01/2020 02:23 PM   Modules accepted: Orders

## 2020-02-01 NOTE — Telephone Encounter (Signed)
She can try [facility].  If she prefers to have the paper printed rx just in case she needs to keep trying pharmacies over the weekend, she can pick it up, I will be back in office on Wednesday.  If she decides by end of day today that she would prefer Flagyl (metronidazole) antibiotic or Augmentin - we can switch to one of those - but usually not quite as effective for this problem - but it could work if she would prefer.  Nobie Putnam, DO [facility] 02/01/2020, 2:07 PM

## 2020-02-01 NOTE — Addendum Note (Signed)
Addended by: Olin Hauser on: 02/01/2020 02:15 PM   Modules accepted: Orders

## 2020-02-01 NOTE — Telephone Encounter (Signed)
Can you get more information on her symptoms?  Julie Pena, [facility] 02/01/2020, 9:04 AM

## 2020-02-01 NOTE — Addendum Note (Signed)
Addended by: Olin Hauser on: 02/01/2020 11:29 AM   Modules accepted: Orders

## 2020-02-01 NOTE — Telephone Encounter (Signed)
Notify patient but couple of pharmacy is out of stock so she is trying almadrate last and will call us back.

## 2020-02-01 NOTE — Telephone Encounter (Signed)
Patient has Hx of Diverticulitis flares up from past two years which gets resolved after catching up early and when treated with Ciprofloxacin and some time diflucan can also Rx, she is asking for Rx instead of masking and going to ER or urgent care during weekend --advised her sometime in order to Rx Antibiotics needs appointment but will pass your message to Dr Raliegh Ip and also virtual appointment  offered but she refused since she is getting dental implant today. She has appointment on 02/07/2020 around 2:40 pm to discuss treatment plan.

## 2020-02-01 NOTE — Addendum Note (Signed)
Addended by: Olin Hauser on: 02/01/2020 01:52 PM   Modules accepted: Orders

## 2020-02-01 NOTE — Telephone Encounter (Signed)
error 

## 2020-02-01 NOTE — Telephone Encounter (Signed)
Reviewed chart and this request.  Please let her know that - I will agree to send Cipro 500 twice a day for 7 days.  She should follow-up as scheduled 11/3.  Seek care sooner at Urgent Care if severe worsening or new concerns.  Julie Putnam, DO [facility] 02/01/2020, 11:29 AM

## 2020-02-07 ENCOUNTER — Other Ambulatory Visit: Payer: Self-pay

## 2020-02-07 ENCOUNTER — Encounter: Payer: Self-pay | Admitting: Family Medicine

## 2020-02-07 ENCOUNTER — Ambulatory Visit (INDEPENDENT_AMBULATORY_CARE_PROVIDER_SITE_OTHER): Payer: Medicare HMO | Admitting: Family Medicine

## 2020-02-07 VITALS — BP 126/54 | HR 57 | Temp 96.8°F | Resp 16 | Ht 66.0 in | Wt 178.6 lb

## 2020-02-07 DIAGNOSIS — R109 Unspecified abdominal pain: Secondary | ICD-10-CM

## 2020-02-07 DIAGNOSIS — K5900 Constipation, unspecified: Secondary | ICD-10-CM

## 2020-02-07 DIAGNOSIS — K5792 Diverticulitis of intestine, part unspecified, without perforation or abscess without bleeding: Secondary | ICD-10-CM

## 2020-02-07 MED ORDER — METRONIDAZOLE 500 MG PO TABS: 500.0000 mg | ORAL_TABLET | Freq: Three times a day (TID) | ORAL | 0 refills | Status: DC

## 2020-02-07 MED ORDER — DICYCLOMINE HCL 10 MG PO CAPS: 10.0000 mg | ORAL_CAPSULE | Freq: Three times a day (TID) | ORAL | 2 refills | Status: DC

## 2020-02-07 NOTE — Progress Notes (Signed)
Subjective:    Patient ID: Julie Pena, female    DOB: 03/03/1951, 69 y.o.   MRN: [id number]  Julie Pena is a 69 y.o. female presenting on 02/07/2020 for Diverticulitis   HPI   Diverticulitis follow-up Constipation Recent history last visit virtual visit 11/27/19 with Cyndia Skeeters, FNP, treated with Cipro antibiotic, she temporarily resolved problem. - In past she has had issues with flare up diverticulitis, previous flare in 06/2019 she took Cipro + Flagyl - She described symptoms of abdominal bloating and gas inside stomach, she has gas pains and pressure, and usually when takes the antibiotic for diverticulitis, it often helps her pass gas and function better. - Admits significant constipation, harder dry stool small pellets and worsening with bloating and rectal discomfort has hemorrhoid swollen has to manually reduce to have BM at times. Not using miralax regularly - Never on Bentyl Dicyclomine.   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 Smoking status: Current Every Day Smoker    Packs/day: 0.50    Years: 47.00    Pack years: 23.50    Types: Cigarettes  . Smokeless tobacco: Current User  . Tobacco comment: enrolled in smoking cessation class starting 08/2019  Vaping Use  . Vaping Use: Never used  Substance Use Topics  . Alcohol use: Never  . Drug use: Never    Review of Systems Per HPI unless specifically indicated above     Objective:    BP (!) 126/54   Pulse (!) 57   Temp (!) 96.8 F (36 C) (Temporal)   Resp 16   Ht 5\' 6"  (1.676 m)   Wt 178 lb 9.6 oz (81 kg)   SpO2 100%   BMI 28.83 kg/m   Wt Readings from Last 3 Encounters:  02/07/20 178 lb 9.6 oz (81 kg)   07/11/19 172 lb (78 kg)  04/12/19 160 lb (72.6 kg)    Physical Exam Vitals and nursing note reviewed.  Constitutional:      General: She is not in acute distress.    Appearance: She is well-developed. She is not diaphoretic.     Comments: Well-appearing, comfortable, cooperative  HENT:     Head: Normocephalic and atraumatic.  Eyes:     General:        Right eye: No discharge.        Left eye: No discharge.     Conjunctiva/sclera: Conjunctivae normal.  Neck:     Thyroid: No thyromegaly.  Cardiovascular:     Rate and Rhythm: Normal rate and regular rhythm.     Heart sounds: Normal heart sounds. No murmur heard.   Pulmonary:     Effort: Pulmonary effort is normal. No respiratory distress.     Breath sounds: Normal breath sounds. No wheezing or rales.  Abdominal:     General: There is no distension.     Palpations: Abdomen is soft. There is no mass.     Tenderness: There is no abdominal tenderness. There is no guarding or rebound.     Hernia: A hernia (umbilical hernia) is present.     Comments: Slow bowel sounds  Musculoskeletal:  General: Normal range of motion.     Cervical back: Normal range of motion and neck supple.  Lymphadenopathy:     Cervical: No cervical adenopathy.  Skin:    General: Skin is warm and dry.     Findings: Lesion (left anterior shoulder clavicular area) present. No erythema or rash.  Neurological:     Mental Status: She is alert and oriented to person, place, and time.  Psychiatric:        Behavior: Behavior normal.     Comments: Well groomed, good eye contact, normal speech and thoughts    Results for orders placed or performed in visit on 12/21/18  Novel Coronavirus, NAA ([facility])   Specimen: Oropharyngeal(OP) collection in vial transport medium   OROPHARYNGEA  TESTING  Result Value Ref Range   SARS-CoV-2, NAA Not Detected Not Detected      Assessment & Plan:   Problem List Items Addressed This Visit    Diverticulitis - Primary    Relevant Medications   metroNIDAZOLE (FLAGYL) 500 MG tablet    Other Visit Diagnoses    Constipation, unspecified constipation type       Abdominal cramping       Relevant Medications   dicyclomine (BENTYL) 10 MG capsule      #Diverticulitis vs Colitis / Constipation Recent flare unresolved on monotherapy Cipro ADD Flagyl today, finish current cipro Chronic constipation contributing factor - will add miralax dosing as advised more regularly and can use preventatively Dicyclomine PRN cramping  #Lipoma L shoulder, slight increase size, otherwise unremarkable  #umbilical hernia stable without complication, no active herniation  Meds ordered this encounter  Medications  . metroNIDAZOLE (FLAGYL) 500 MG tablet    Sig: Take 1 tablet (500 mg total) by mouth 3 (three) times daily.    Dispense:  21 tablet    Refill:  0  . dicyclomine (BENTYL) 10 MG capsule    Sig: Take 1 capsule (10 mg total) by mouth 4 (four) times daily -  before meals and at bedtime. As needed for bloating, cramping    Dispense:  30 capsule    Refill:  2      Follow up plan: Return if symptoms worsen or fail to improve, for diverticulitis.   Nobie Putnam, [facility] 02/07/2020, 3:22 PM

## 2020-02-07 NOTE — Patient Instructions (Addendum)
Thank you for coming to the office today.  I recommend the Prevnar-13 (pneumonia vaccine) then repeat Pneumovax-23 in 1 year.  Start the Flagyl now for 3 times a day for 7 days.  For Constipation (less frequent bowel movement that can be hard dry or involve straining).  Recommend trying OTC Miralax 17g = 1 capful in large glass water once daily for now, try several days to see if working, goal is soft stool or BM 1-2 times daily, if too loose then reduce dose or try every other day. If not effective may need to increase it to 2 doses at once in AM or may do 1 in morning and 1 in afternoon/evening  - This medicine is very safe and can be used often without any problem and will not make you dehydrated. It is good for use on AS NEEDED BASIS or even MAINTENANCE therapy for longer term for several days to weeks at a time to help regulate bowel movements  Other more natural remedies or preventative treatment: - Increase hydration with water - Increase fiber in diet (high fiber foods = vegetables, leafy greens, oats/grains) - May take OTC Fiber supplement (metamucil powder or pill/gummy) - May try OTC Probiotic  Dicyclomine PRN for cramping as needed with meals.  Future may need hemorrhoid removed by surgeon   Please schedule a Follow-up Appointment to: Return if symptoms worsen or fail to improve, for diverticulitis.  If you have any other questions or concerns, please feel free to call the office or send a message through [place]. You may also schedule an earlier appointment if necessary.  Additionally, you may be receiving a survey about your experience at our office within a few days to 1 week by e-mail or mail. We value your feedback.  Nobie Putnam, DO [place]

## 2020-03-19 ENCOUNTER — Ambulatory Visit: Payer: Self-pay

## 2020-03-19 ENCOUNTER — Other Ambulatory Visit: Payer: Self-pay

## 2020-03-19 ENCOUNTER — Encounter: Payer: Self-pay | Admitting: Family Medicine

## 2020-03-19 ENCOUNTER — Telehealth (INDEPENDENT_AMBULATORY_CARE_PROVIDER_SITE_OTHER): Payer: Medicare HMO | Admitting: Family Medicine

## 2020-03-19 VITALS — BP 142/71 | HR 103 | Temp 99.5°F

## 2020-03-19 DIAGNOSIS — Z20822 Contact with and (suspected) exposure to covid-19: Secondary | ICD-10-CM | POA: Diagnosis not present

## 2020-03-19 NOTE — Progress Notes (Signed)
Virtual Visit via Telephone The purpose of this virtual visit is to provide medical care while limiting exposure to the novel coronavirus (COVID19) for both patient and office staff.  Consent was obtained for phone visit:  Yes.   Answered questions that patient had about telehealth interaction:  Yes.   I discussed the limitations, risks, security and privacy concerns of performing an evaluation and management service by telephone. I also discussed with the patient that there may be a patient responsible charge related to this service. The patient expressed understanding and agreed to proceed.  Patient Location: Home Provider Location: [facility] (Office)  Participants in virtual visit: - Patient: Julie Pena. Geanie Cooley - CMA: Frederich Cha, CMA - Provider: Dr Parks Ranger  ---------------------------------------------------------------------- Chief Complaint  Patient presents with  . Covid Exposure    Pt was around her daughter & son-n-law on Friday both was diagnose with COVID the following day. Pt complains head congestion, productive cough,  bodyaches, fever 101 , frequent bm and throat irritation x 1 days . The pt had a negative home COVID test this morning and admits that she is unvaccinated.     S: Reviewed CMA documentation. I have called patient and gathered additional HPI as follows:  COVID, Suspected Close contact with COVID on 03/15/20. Reports that symptoms started mild with sore dry throat and nasal congestion on Sunday 03/17/20. She also reports complicating history that she had sinus symptoms about 1 week ago around 03/08/20. She did a home COVID test this morning and it was negative. She felt feverish last night and took temperature 101F.  Denies any high risk travel to areas of current concern for COVID19. Denies any known or suspected exposure to person with or possibly with COVID19.  Denies any shortness of breath, sinus pain or pressure, headache, abdominal  pain, diarrhea  Past Medical History:  Diagnosis Date  . Diverticulosis    Social History   Tobacco Use  . Smoking status: Current Every Day Smoker    Packs/day: 0.50    Years: 47.00    Pack years: 23.50    Types: Cigarettes  . Smokeless tobacco: Never Used  . Tobacco comment: enrolled in smoking cessation class starting 08/2019  Vaping Use  . Vaping Use: Never used  Substance Use Topics  . Alcohol use: Never  . Drug use: Never    Current Outpatient Medications:  .  Polyethylene Glycol 3350 (MIRALAX PO), Take by mouth., Disp: , Rfl:  .  Probiotic Product (ALIGN PO), Take by mouth. Take probiotic daily, Disp: , Rfl:  .  fexofenadine-pseudoephedrine (ALLEGRA-D 24) 180-240 MG 24 hr tablet, Take 1 tablet by mouth daily. (Patient not taking: Reported on 03/19/2020), Disp: , Rfl:   Depression screen [facility] 2/9 07/11/2019 11/10/2018 06/29/2018  Decreased Interest 0 0 0  Down, Depressed, Hopeless 0 0 0  PHQ - 2 Score 0 0 0    No flowsheet data found.  -------------------------------------------------------------------------- O: No physical exam performed due to remote telephone encounter.  Lab results reviewed.  No results found for this or any previous visit (from the past 2160 hour(s)).  -------------------------------------------------------------------------- A&P:  Problem List Items Addressed This Visit   None   Visit Diagnoses    Suspected COVID-19 virus infection    -  Primary     Possible COVID, given known exposure Unvaccinated Initial test within 2-3 days of exposure negative Advised she should repeat COVID test after day 7 from initial onset symptoms For now mild symptoms offered symptomatic management  and she declined, (steroids, albuterol inhaler, cough medicine, nasal congestion treatment) If repeat COVID test positive, she may warrant IV antibody therapy can refer, or can offer symptom control again, or if severe to go to hospital ED for further treatment If  COVID test negative, she is likely cleared for COVID at this time and would warrant possible antibiotic for sinusitis if persistent or worse sinus symptoms or can consider other treatment options.  No orders of the defined types were placed in this encounter.   Follow-up: PRN  Patient verbalizes understanding with the above medical recommendations including the limitation of remote medical advice.  Specific follow-up and call-back criteria were given for patient to follow-up or seek medical care more urgently if needed.   - Time spent in direct consultation with patient on phone: 15 minutes   Nobie Putnam, [facility] 03/19/2020, 3:57 PM

## 2020-03-19 NOTE — Patient Instructions (Addendum)
   Please schedule a Follow-up Appointment to: Return in about 1 week (around 03/26/2020), or if symptoms worsen or fail to improve.  If you have any other questions or concerns, please feel free to call the office or send a message through [place]. You may also schedule an earlier appointment if necessary.  Additionally, you may be receiving a survey about your experience at our office within a few days to 1 week by e-mail or mail. We value your feedback.  Nobie Putnam, DO [place]

## 2020-03-19 NOTE — Telephone Encounter (Signed)
Pt. States daughter and son-in-law have tested positive for COVID 19. She was with them this weekend. Developed symptoms Sunday - fever, cough, sinus congestion, achy. Home test was negative. Spoke with Apolonio Schneiders in the practice. Virtual visit for today. Answer Assessment - Initial Assessment Questions 1. COVID-19 DIAGNOSIS: "Who made your Coronavirus (COVID-19) diagnosis?" "Was it confirmed by a positive lab test?" If not diagnosed by a HCP, ask "Are there lots of cases (community spread) where you live?" (See public health department website, if unsure)     Home test negative 2. COVID-19 EXPOSURE: "Was there any known exposure to COVID before the symptoms began?" CDC Definition of close contact: within 6 feet (2 meters) for a total of 15 minutes or more over a 24-hour period.      Yes 3. ONSET: "When did the COVID-19 symptoms start?"      Sunday 4. WORST SYMPTOM: "What is your worst symptom?" (e.g., cough, fever, shortness of breath, muscle aches)     Nasal congestion 5. COUGH: "Do you have a cough?" If Yes, ask: "How bad is the cough?"       Yes 6. FEVER: "Do you have a fever?" If Yes, ask: "What is your temperature, how was it measured, and when did it start?"     Yes - 100 7. RESPIRATORY STATUS: "Describe your breathing?" (e.g., shortness of breath, wheezing, unable to speak)      No 8. BETTER-SAME-WORSE: "Are you getting better, staying the same or getting worse compared to yesterday?"  If getting worse, ask, "In what way?"     Same 9. HIGH RISK DISEASE: "Do you have any chronic medical problems?" (e.g., asthma, heart or lung disease, weak immune system, obesity, etc.)     No 10. PREGNANCY: "Is there any chance you are pregnant?" "When was your last menstrual period?"       No 11. OTHER SYMPTOMS: "Do you have any other symptoms?"  (e.g., chills, fatigue, headache, loss of smell or taste, muscle pain, sore throat; new loss of smell or taste especially support the diagnosis of COVID-19)        Achy, fever, congestion,  Protocols used: CORONAVIRUS (COVID-19) DIAGNOSED OR SUSPECTED-A-AH

## 2020-03-20 ENCOUNTER — Telehealth: Payer: Self-pay | Admitting: Family Medicine

## 2020-03-20 DIAGNOSIS — U071 COVID-19: Secondary | ICD-10-CM

## 2020-03-20 NOTE — Telephone Encounter (Signed)
Pt called to let Dr. Parks Ranger know she did test positive for covid today / Pt mentioned that Dr. Raliegh Ip mentioned the infusion / pt would like to know what Dr. Raliegh Ip recommends she do next/ please advise

## 2020-03-20 NOTE — Telephone Encounter (Signed)
Patient notified

## 2020-03-20 NOTE — Telephone Encounter (Signed)
Yes, I recommended patient notify us as soon as she retested for COVID and now it is positive. I do recommend IV antibody infusion therapy. I have sent an [place] referral to the [facility] and they have responded today 03/20/20 around 3:15pm and they will process her referral and notify patient within 24-48 hours with instructions for treatment.  Also she sent a mychart message, I have responded to that with this information.  Would you be able to contact patient directly today and let her know to check her mychart and remind her of this plan? If she doesn't hear back from the [facility] by mid day on Friday this week should let us know.  Julie Pena, [facility] 03/20/2020, 3:19 PM

## 2020-03-21 ENCOUNTER — Encounter: Payer: Self-pay | Admitting: Family

## 2020-03-21 ENCOUNTER — Telehealth: Payer: Self-pay | Admitting: Family

## 2020-03-21 ENCOUNTER — Other Ambulatory Visit: Payer: Self-pay | Admitting: Family

## 2020-03-21 ENCOUNTER — Telehealth: Payer: Medicare HMO | Admitting: Family Medicine

## 2020-03-21 DIAGNOSIS — U071 COVID-19: Secondary | ICD-10-CM

## 2020-03-21 MED ORDER — PREDNISONE 20 MG PO TABS: ORAL_TABLET | ORAL | 0 refills | Status: DC

## 2020-03-21 NOTE — Telephone Encounter (Signed)
  Daughter and husband both have it who she was exposed to. Exposed Friday evening 03/15/20. Positive symptoms 03/17/20. Positive test 03/21/20. Home test photo in [place].   I connected by phone with Sherry Ruffing on 03/21/2020 at 3:10 PM to discuss the potential use of a new treatment for mild to moderate COVID-19 viral infection in non-hospitalized patients.  This patient is a 69 y.o. female that meets the FDA criteria for Emergency Use Authorization of COVID monoclonal antibody casirivimab/imdevimab, bamlanivimab/etesevimab, or sotrovimab.  Has a (+) direct SARS-CoV-2 viral test result  Has mild or moderate COVID-19   Is NOT hospitalized due to COVID-19  Is within 10 days of symptom onset  Has at least one of the high risk factor(s) for progression to severe COVID-19 and/or hospitalization as defined in EUA.  Specific high risk criteria : Older age (>/= 69 yo) and BMI > 25   I have spoken and communicated the following to the patient or parent/caregiver regarding COVID monoclonal antibody treatment:  1. FDA has authorized the emergency use for the treatment of mild to moderate COVID-19 in adults and pediatric patients with positive results of direct SARS-CoV-2 viral testing who are 32 years of age and older weighing at least 40 kg, and who are at high risk for progressing to severe COVID-19 and/or hospitalization.  2. The significant known and potential risks and benefits of COVID monoclonal antibody, and the extent to which such potential risks and benefits are unknown.  3. Information on available alternative treatments and the risks and benefits of those alternatives, including clinical trials.  4. Patients treated with COVID monoclonal antibody should continue to self-isolate and use infection control measures (e.g., wear mask, isolate, social distance, avoid sharing personal items, clean and disinfect "high touch" surfaces, and frequent handwashing) according to CDC guidelines.    5. The patient or parent/caregiver has the option to accept or refuse COVID monoclonal antibody treatment.  After reviewing this information with the patient, the patient has agreed to receive one of the available covid 19 monoclonal antibodies and will be provided an appropriate fact sheet prior to infusion. Loel Dubonnet, NP 03/21/2020 3:10 PM

## 2020-03-21 NOTE — Addendum Note (Signed)
Addended by: Olin Hauser on: 03/21/2020 02:07 PM   Modules accepted: Orders

## 2020-03-22 ENCOUNTER — Ambulatory Visit (HOSPITAL_COMMUNITY)
Admission: RE | Admit: 2020-03-22 | Discharge: 2020-03-22 | Disposition: A | Discharge status: Home / Self Care | Payer: Medicare Other | Source: Ambulatory Visit | Attending: Pulmonary Disease | Admitting: Pulmonary Disease

## 2020-03-22 DIAGNOSIS — U071 COVID-19: Secondary | ICD-10-CM | POA: Diagnosis present

## 2020-03-22 DIAGNOSIS — Z23 Encounter for immunization: Secondary | ICD-10-CM | POA: Insufficient documentation

## 2020-03-22 MED ORDER — DIPHENHYDRAMINE HCL 50 MG/ML IJ SOLN: 50.0000 mg | Freq: Once | INTRAMUSCULAR | Status: DC | PRN

## 2020-03-22 MED ORDER — ALBUTEROL SULFATE HFA 108 (90 BASE) MCG/ACT IN AERS: 2.0000 | INHALATION_SPRAY | Freq: Once | RESPIRATORY_TRACT | Status: DC | PRN

## 2020-03-22 MED ORDER — SODIUM CHLORIDE 0.9 % IV SOLN: Freq: Once | INTRAVENOUS | Status: AC

## 2020-03-22 MED ORDER — EPINEPHRINE 0.3 MG/0.3ML IJ SOAJ: 0.3000 mg | Freq: Once | INTRAMUSCULAR | Status: DC | PRN

## 2020-03-22 MED ORDER — FAMOTIDINE IN NACL 20-0.9 MG/50ML-% IV SOLN: 20.0000 mg | Freq: Once | INTRAVENOUS | Status: DC | PRN

## 2020-03-22 MED ORDER — SODIUM CHLORIDE 0.9 % IV SOLN: INTRAVENOUS | Status: DC | PRN

## 2020-03-22 MED ORDER — METHYLPREDNISOLONE SODIUM SUCC 125 MG IJ SOLR: 125.0000 mg | Freq: Once | INTRAMUSCULAR | Status: DC | PRN

## 2020-03-22 NOTE — Progress Notes (Signed)
Patient reviewed Fact Sheet for Patients, Parents, and Caregivers for Emergency Use Authorization (EUA) of bamlanivimab and etesevimab for the Treatment of Coronavirus. Patient also reviewed and is agreeable to the estimated cost of treatment. Patient is agreeable to proceed.   

## 2020-03-22 NOTE — Discharge Instructions (Signed)
10 Things You Can Do to Manage Your COVID-19 Symptoms at Home If you have possible or confirmed COVID-19: 1. Stay home from work and school. And stay away from other public places. If you must go out, avoid using any kind of public transportation, ridesharing, or taxis. 2. Monitor your symptoms carefully. If your symptoms get worse, call your healthcare provider immediately. 3. Get rest and stay hydrated. 4. If you have a medical appointment, call the healthcare provider ahead of time and tell them that you have or may have COVID-19. 5. For medical emergencies, call 911 and notify the dispatch personnel that you have or may have COVID-19. 6. Cover your cough and sneezes with a tissue or use the inside of your elbow. 7. Wash your hands often with soap and water for at least 20 seconds or clean your hands with an alcohol-based hand sanitizer that contains at least 60% alcohol. 8. As much as possible, stay in a specific room and away from other people in your home. Also, you should use a separate bathroom, if available. If you need to be around other people in or outside of the home, wear a mask. 9. Avoid sharing personal items with other people in your household, like dishes, towels, and bedding. 10. Clean all surfaces that are touched often, like counters, tabletops, and doorknobs. Use household cleaning sprays or wipes according to the label instructions. cdc.gov/coronavirus 10/05/2018 This information is not intended to replace advice given to you by your health care provider. Make sure you discuss any questions you have with your health care provider. Document Revised: 03/09/2019 Document Reviewed: 03/09/2019 Elsevier Patient Education  2020 Elsevier Inc. What types of side effects do monoclonal antibody drugs cause?  Common side effects  In general, the more common side effects caused by monoclonal antibody drugs include: . Allergic reactions, such as hives or itching . Flu-like signs and  symptoms, including chills, fatigue, fever, and muscle aches and pains . Nausea, vomiting . Diarrhea . Skin rashes . Low blood pressure   The CDC is recommending patients who receive monoclonal antibody treatments wait at least 90 days before being vaccinated.  Currently, there are no data on the safety and efficacy of mRNA COVID-19 vaccines in persons who received monoclonal antibodies or convalescent plasma as part of COVID-19 treatment. Based on the estimated half-life of such therapies as well as evidence suggesting that reinfection is uncommon in the 90 days after initial infection, vaccination should be deferred for at least 90 days, as a precautionary measure until additional information becomes available, to avoid interference of the antibody treatment with vaccine-induced immune responses. If you have any questions or concerns after the infusion please call the Advanced Practice Provider on call at [phone]. This number is ONLY intended for your use regarding questions or concerns about the infusion post-treatment side-effects.  Please do not provide this number to others for use. For return to work notes please contact your primary care provider.   If someone you know is interested in receiving treatment please have them call the COVID hotline at [phone].   

## 2020-03-22 NOTE — Progress Notes (Signed)
  Diagnosis: COVID-19  Physician: Dr. Wright   Procedure: [facility] Med: bamlanivimab\etesevimab infusion - Provided patient with bamlanimivab\etesevimab fact sheet for patients, parents and caregivers prior to infusion.  Complications: No immediate complications noted.  Discharge: Discharged home   Julie Pena  [facility] [name] 03/22/2020   

## 2020-04-02 ENCOUNTER — Other Ambulatory Visit: Payer: Self-pay

## 2020-04-02 ENCOUNTER — Ambulatory Visit (INDEPENDENT_AMBULATORY_CARE_PROVIDER_SITE_OTHER): Payer: Medicare HMO | Admitting: Family Medicine

## 2020-04-02 ENCOUNTER — Encounter: Payer: Self-pay | Admitting: Family Medicine

## 2020-04-02 DIAGNOSIS — Z8616 Personal history of COVID-19: Secondary | ICD-10-CM | POA: Diagnosis not present

## 2020-04-02 DIAGNOSIS — J0100 Acute maxillary sinusitis, unspecified: Secondary | ICD-10-CM | POA: Diagnosis not present

## 2020-04-02 MED ORDER — AMOXICILLIN-POT CLAVULANATE 875-125 MG PO TABS: 1.0000 | ORAL_TABLET | Freq: Two times a day (BID) | ORAL | 0 refills | Status: DC

## 2020-04-02 NOTE — Progress Notes (Signed)
Virtual Visit via Telephone The purpose of this virtual visit is to provide medical care while limiting exposure to the novel coronavirus (COVID19) for both patient and office staff.  Consent was obtained for phone visit:  Yes.   Answered questions that patient had about telehealth interaction:  Yes.   I discussed the limitations, risks, security and privacy concerns of performing an evaluation and management service by telephone. I also discussed with the patient that there may be a patient responsible charge related to this service. The patient expressed understanding and agreed to proceed.  Patient Location: Home Provider Location: [facility] (Office)  Participants in virtual visit: - Patient: Julie Pena. Geanie Cooley - CMA: Frederich Cha, CMA - Provider: Dr Parks Ranger  ---------------------------------------------------------------------- Chief Complaint  Patient presents with  . Ear Pain    Onset yesterday Both side --HA --feeling weak was improved from Covid but all these Sxs coming back--productive cough getting better low grade fever 99.6 F    S: Reviewed CMA documentation. I have called patient and gathered additional HPI as follows:  Sinusitis / Ear Pain, bilateral / Sore throat / Mild Headache History of COVID  Background history with recent COVID diagnosis 03/19/20 and confirmed tested positive COVID 03/20/20, she has received Monoclonal antibody infusion 03/21/20. She has improved overall significantly.  Reports that symptoms started last night with early onset symptoms, now 2 weeks later. With some recurrent symptoms - Temp mild elevated 99.6 to 99.53F oral Admits mild productive cough unchanged from previous, previously with covid it has not completely resolved. Admits sinus pain and pressure and congestion drainage, some raw throat as well  Denies any chills, sweats, body ache, shortness of breath, abdominal pain, diarrhea  Past Medical History:   Diagnosis Date  . Diverticulosis    Social History   Tobacco Use  . Smoking status: Current Some Day Smoker    Packs/day: 0.50    Years: 47.00    Pack years: 23.50    Types: Cigarettes  . Smokeless tobacco: Current User  . Tobacco comment: enrolled in smoking cessation class starting 08/2019  Vaping Use  . Vaping Use: Never used  Substance Use Topics  . Alcohol use: Never  . Drug use: Never    Current Outpatient Medications:  .  amoxicillin-clavulanate (AUGMENTIN) 875-125 MG tablet, Take 1 tablet by mouth 2 (two) times daily. For 10 days, Disp: 20 tablet, Rfl: 0 .  fexofenadine-pseudoephedrine (ALLEGRA-D 24) 180-240 MG 24 hr tablet, Take 1 tablet by mouth daily., Disp: , Rfl:  .  Polyethylene Glycol 3350 (MIRALAX PO), Take by mouth., Disp: , Rfl:  .  Probiotic Product (ALIGN PO), Take by mouth. Take probiotic daily, Disp: , Rfl:   Depression screen [facility] 2/9 07/11/2019 11/10/2018 06/29/2018  Decreased Interest 0 0 0  Down, Depressed, Hopeless 0 0 0  PHQ - 2 Score 0 0 0    No flowsheet data found.  -------------------------------------------------------------------------- O: No physical exam performed due to remote telephone encounter.  Lab results reviewed.  No results found for this or any previous visit (from the past 2160 hour(s)).  -------------------------------------------------------------------------- A&P:  Problem List Items Addressed This Visit   None   Visit Diagnoses    Acute non-recurrent maxillary sinusitis    -  Primary   Relevant Medications   amoxicillin-clavulanate (AUGMENTIN) 875-125 MG tablet   History of COVID-19         Consistent with acute maxillary vs frontal sinusitis, likely initially COVID 2 weeks ago with initial sick trigger,  then has had recurrence or second sickening now 2 weeks later after treated COVID, IV antibody infusion worsening concern for bacterial infection. No acute respiratory symptoms at this time.  Defer chest X-ray  now.  Plan: 1 Start Augmentin 875-125mg  PO BID x 10 days 2. May use allergy med / decongestant / flonase / supportive care for sinuses  Return criteria reviewed   Meds ordered this encounter  Medications  . amoxicillin-clavulanate (AUGMENTIN) 875-125 MG tablet    Sig: Take 1 tablet by mouth 2 (two) times daily. For 10 days    Dispense:  20 tablet    Refill:  0    Follow-up: - Return as needed within 2 weeks if unresolved.  Patient verbalizes understanding with the above medical recommendations including the limitation of remote medical advice.  Specific follow-up and call-back criteria were given for patient to follow-up or seek medical care more urgently if needed.   - Time spent in direct consultation with patient on phone: 10 minutes  Saralyn Pilar, DO [facility] 04/02/2020, 1:52 PM

## 2020-04-02 NOTE — Patient Instructions (Addendum)
  1. It sounds like you have a Sinusitis (Bacterial Infection) - this most likely started as an Upper Respiratory Virus that has settled into an infection. Allergies can also cause this. - Start Augmentin 1 pill twice daily (breakfast and dinner, with food and plenty of water) for 10 days, complete entire course, do not stop early even if feeling better - Start Loratadine (Claritin) 10mg  daily and Flonase 2 sprays in each nostril daily for next 4-6 weeks, then you may stop and use seasonally or as needed - Recommend to try using Nasal Saline spray multiple times a day to help flush out congestion and clear sinuses - Improve hydration by drinking plenty of clear fluids (water, gatorade) to reduce secretions and thin congestion - Congestion draining down throat can cause irritation. May try warm herbal tea with honey, cough drops - Can take Tylenol or Ibuprofen as needed for fevers - May trial over the counter cold medicine, I would not use any decongestant or mucinex longer than 7 days.    Please schedule a Follow-up Appointment to: Return in about 2 weeks (around 04/16/2020), or if symptoms worsen or fail to improve, for 2 weeks sinusitis if unresolved.  If you have any other questions or concerns, please feel free to call the office or send a message through MyChart. You may also schedule an earlier appointment if necessary.  Additionally, you may be receiving a survey about your experience at our office within a few days to 1 week by e-mail or mail. We value your feedback.  06/14/2020, DO [facility], [facility]

## 2020-04-03 ENCOUNTER — Telehealth: Payer: Self-pay | Admitting: *Deleted

## 2020-04-03 ENCOUNTER — Encounter: Payer: Self-pay | Admitting: *Deleted

## 2020-04-03 NOTE — Telephone Encounter (Signed)
Attempted to contact to schedule annual lung screening scan. However, there is no answer or voicemail option. Will send mychart message.

## 2020-05-08 ENCOUNTER — Telehealth: Payer: Self-pay | Admitting: *Deleted

## 2020-05-08 NOTE — Telephone Encounter (Signed)
contacted patient low-dose CT lung screening.  Wife and husband are both wanting to do it however they have had Covid and after getting through with Covid they had a GI virus and then since then they had a sinus infection and was given antibiotics.  PCP Dr. Parks Ranger suggested they not had any scans for a while and patient said she would probably send a MyChart message either March or April to get scheduled for her and her husband have a low-dose CT scan

## 2020-06-17 ENCOUNTER — Telehealth: Payer: Self-pay | Admitting: *Deleted

## 2020-06-17 NOTE — Telephone Encounter (Signed)
Attempted to contact patient to schedule lung screening. Left message to call Shawn at [phone]. 

## 2020-06-28 ENCOUNTER — Ambulatory Visit: Payer: Self-pay | Admitting: *Deleted

## 2020-06-28 ENCOUNTER — Encounter: Payer: Self-pay | Admitting: Family Medicine

## 2020-06-28 ENCOUNTER — Other Ambulatory Visit: Payer: Self-pay | Admitting: Family Medicine

## 2020-06-28 ENCOUNTER — Other Ambulatory Visit: Payer: Self-pay

## 2020-06-28 ENCOUNTER — Telehealth (INDEPENDENT_AMBULATORY_CARE_PROVIDER_SITE_OTHER): Payer: Medicare HMO | Admitting: Family Medicine

## 2020-06-28 VITALS — BP 134/69 | HR 87 | Temp 98.4°F | Ht 66.0 in | Wt 173.5 lb

## 2020-06-28 DIAGNOSIS — E663 Overweight: Secondary | ICD-10-CM

## 2020-06-28 DIAGNOSIS — R109 Unspecified abdominal pain: Secondary | ICD-10-CM

## 2020-06-28 DIAGNOSIS — K5900 Constipation, unspecified: Secondary | ICD-10-CM

## 2020-06-28 DIAGNOSIS — E785 Hyperlipidemia, unspecified: Secondary | ICD-10-CM

## 2020-06-28 NOTE — Telephone Encounter (Signed)
Pt called with complaints of abdominal pain since 06/21/20; the pain is located in her lower abdomen above hair line and in her lower back; it is rated 1-2 in the morning and 6-7 out of 10; she had a fever of 99-100's; she also has a hernia and diverticulitis but this pain is different; the pt says she has been taking Metamucil and she does not have a BM often; she says when a gas bubble occurs, she feels better after she has a BM; recommendations made per nurse triage protocol; she verbalized understanding; decision tree completed; pt offered and accepted MyChart appt with Dr Parks Ranger, Rocco Serene, 06/28/20 at 1500; the pt can be contacted at [phone] if she can be seen in the office; will route for notification.  Reason for Disposition  [1] MODERATE pain (e.g., interferes with normal activities) AND [2] pain comes and goes (cramps) AND [3] present > 24 hours  (Exception: pain with Vomiting or Diarrhea - see that Guideline)  Answer Assessment - Initial Assessment Questions 1. LOCATION: "Where does it hurt?"      Lower abdomen above hairline 2. RADIATION: "Does the pain shoot anywhere else?" (e.g., chest, back)     Lower back 3. ONSET: "When did the pain begin?" (e.g., minutes, hours or days ago)   06/21/20 4. SUDDEN: "Gradual or sudden onset?"    abdome 5. PATTERN "Does the pain come and go, or is it constant?"    - If constant: "Is it getting better, staying the same, or worsening?"      (Note: Constant means the pain never goes away completely; most serious pain is constant and it progresses)     - If intermittent: "How long does it last?" "Do you have pain now?"     (Note: Intermittent means the pain goes away completely between bouts)     intermittent 6. SEVERITY: "How bad is the pain?"  (e.g., Scale 1-10; mild, moderate, or severe)   - MILD (1-3): doesn't interfere with normal activities, abdomen soft and not tender to touch    - MODERATE (4-7): interferes with normal activities or  awakens from sleep, tender to touch    - SEVERE (8-10): excruciating pain, doubled over, unable to do any normal activities     1-6 out of 10 7. RECURRENT SYMPTOM: "Have you ever had this type of stomach pain before?" If Yes, ask: "When was the last time?" and "What happened that time?"      no 8. CAUSE: "What do you think is causing the stomach pain?"     Not sure 9. RELIEVING/AGGRAVATING FACTORS: "What makes it better or worse?" (e.g., movement, antacids, bowel movement)     Taking 2 advil or heat to area; having BM helps 10. OTHER SYMPTOMS: "Has there been any vomiting, diarrhea, constipation, or urine problems?"       Urinating more than normal; gas bubble pain; intermittent waves of nausea 11. PREGNANCY: "Is there any chance you are pregnant?" "When was your last menstrual period?"       no  Protocols used: ABDOMINAL PAIN - [facility]

## 2020-06-28 NOTE — Progress Notes (Signed)
Virtual Visit via Telephone The purpose of this virtual visit is to provide medical care while limiting exposure to the novel coronavirus (COVID19) for both patient and office staff.  Consent was obtained for phone visit:  Yes.   Answered questions that patient had about telehealth interaction:  Yes.   I discussed the limitations, risks, security and privacy concerns of performing an evaluation and management service by telephone. I also discussed with the patient that there may be a patient responsible charge related to this service. The patient expressed understanding and agreed to proceed.  Patient Location: Home Provider Location: [facility] (Office)  Participants in virtual visit: - Patient: Julie Pena. Geanie Cooley - CMA: Orinda Kenner, CMA - Provider: Dr Parks Ranger  ---------------------------------------------------------------------- Chief Complaint  Patient presents with  . Abdominal Pain  . Fever    S: Reviewed CMA documentation. I have called patient and gathered additional HPI as follows:  Constipation / Abdominal Cramping  Reports that symptoms started last Wednesday 06/19/20, she was eating a Pork Chop and said that she has reduced taste or smell and did not notice until she saw the middle of pork chop that was pink. Then on Saturday she felt gassy stomach, then worsening constipation and pressure around hemorrhoids, she continues Miralax with good results. Felt constipated only for 1-2 days. She did realize that it was not diverticulitis. - Now this past few days, if she passes gas, belches, or has BM in the lower stomach would go away, often seems to flare up worse in evening. - She would get a red inflamed rash upper body and extremities if fever, she had mild low 100F on Tues. - Today she does not feel bad. Overall improved. Today she has had 2 bowel movements today. Had one abdominal pain and it did improve. Today no fever.  02/2020 last visit for  diverticulitis, has been on Cipro / Flagyl - Never on Bentyl Dicyclomine.  She has physical apt 1.5 weeks.  Denies any high risk travel to areas of current concern for COVID19. Denies any known or suspected exposure to person with or possibly with COVID19.  Denies any fevers, chills, sweats, body ache, cough, shortness of breath, sinus pain or pressure, headache, abdominal pain, diarrhea  Past Medical History:  Diagnosis Date  . Diverticulosis    Social History   Tobacco Use  . Smoking status: Current Some Day Smoker    Packs/day: 0.50    Years: 47.00    Pack years: 23.50    Types: Cigarettes  . Smokeless tobacco: Current User  . Tobacco comment: enrolled in smoking cessation class starting 08/2019  Vaping Use  . Vaping Use: Never used  Substance Use Topics  . Alcohol use: Never  . Drug use: Never    Current Outpatient Medications:  .  amoxicillin-clavulanate (AUGMENTIN) 875-125 MG tablet, Take 1 tablet by mouth 2 (two) times daily. For 10 days, Disp: 20 tablet, Rfl: 0 .  fexofenadine-pseudoephedrine (ALLEGRA-D 24) 180-240 MG 24 hr tablet, Take 1 tablet by mouth daily., Disp: , Rfl:  .  Polyethylene Glycol 3350 (MIRALAX PO), Take by mouth., Disp: , Rfl:  .  Probiotic Product (ALIGN PO), Take by mouth. Take probiotic daily, Disp: , Rfl:   Depression screen [facility] 2/9 07/11/2019 11/10/2018 06/29/2018  Decreased Interest 0 0 0  Down, Depressed, Hopeless 0 0 0  PHQ - 2 Score 0 0 0    No flowsheet data found.  -------------------------------------------------------------------------- O: No physical exam performed due to remote  telephone encounter.  Lab results reviewed.  No results found for this or any previous visit (from the past 2160 hour(s)).  -------------------------------------------------------------------------- A&P:  Problem List Items Addressed This Visit   None   Visit Diagnoses    Constipation, unspecified constipation type    -  Primary   Abdominal  cramping         Clinically constellation of functional GI symptoms has resolved Not indicative of acute diverticulitis LIkely related constipation and cramping Continue current regimen w/ miralax May try dicyclomine previously rx Follow-up PRN Return criteria given  No orders of the defined types were placed in this encounter.   Follow-up: - Return as scheduled in April 2022  Patient verbalizes understanding with the above medical recommendations including the limitation of remote medical advice.  Specific follow-up and call-back criteria were given for patient to follow-up or seek medical care more urgently if needed.  Total duration of direct patient care provided via video conference: 12 minutes    Nobie Putnam, [facility] 06/28/2020, 3:40 PM

## 2020-06-28 NOTE — Patient Instructions (Addendum)
Likely functional GI symptoms w/ constipation Hemorrhoids will make it worse. You may try the Dicyclomine as needed for cramping. Keep on miralax as need for Constipation  If severe worsening and fever returns or worse pain or other problem, please seek care more immediately at hospital ED or URgent care.  Please schedule a Follow-up Appointment to: Return if symptoms worsen or fail to improve.  If you have any other questions or concerns, please feel free to call the office or send a message through [facility]. You may also schedule an earlier appointment if necessary.  Additionally, you may be receiving a survey about your experience at our office within a few days to 1 week by e-mail or mail. We value your feedback.  Nobie Putnam, DO [facility]

## 2020-07-02 ENCOUNTER — Other Ambulatory Visit: Payer: Self-pay | Admitting: *Deleted

## 2020-07-02 DIAGNOSIS — E663 Overweight: Secondary | ICD-10-CM

## 2020-07-02 DIAGNOSIS — E785 Hyperlipidemia, unspecified: Secondary | ICD-10-CM

## 2020-07-03 ENCOUNTER — Other Ambulatory Visit: Payer: Self-pay

## 2020-07-03 ENCOUNTER — Other Ambulatory Visit: Payer: Medicare HMO

## 2020-07-03 DIAGNOSIS — E663 Overweight: Secondary | ICD-10-CM | POA: Diagnosis not present

## 2020-07-03 DIAGNOSIS — E785 Hyperlipidemia, unspecified: Secondary | ICD-10-CM | POA: Diagnosis not present

## 2020-07-04 LAB — LIPID PANEL
Cholesterol: 188 mg/dL (ref <200)
HDL: 43 mg/dL — ABNORMAL LOW (ref >=50)
LDL Cholesterol (Calc): 115 mg/dL (calc) — ABNORMAL HIGH
Non-HDL Cholesterol (Calc): 145 mg/dL (calc) — ABNORMAL HIGH (ref <130)
Total CHOL/HDL Ratio: 4.4 (calc) (ref <5.0)
Triglycerides: 183 mg/dL — ABNORMAL HIGH (ref <150)

## 2020-07-04 LAB — CBC WITH DIFFERENTIAL/PLATELET
Absolute Monocytes: 329 cells/uL (ref 200–950)
Basophils Absolute: 22 cells/uL (ref 0–200)
Basophils Relative: 0.4 %
Eosinophils Absolute: 70 cells/uL (ref 15–500)
Eosinophils Relative: 1.3 %
HCT: 41.5 % (ref 35.0–45.0)
Hemoglobin: 13.8 g/dL (ref 11.7–15.5)
Lymphs Abs: 1280 cells/uL (ref 850–3900)
MCH: 30.7 pg (ref 27.0–33.0)
MCHC: 33.3 g/dL (ref 32.0–36.0)
MCV: 92.2 fL (ref 80.0–100.0)
MPV: 9.9 fL (ref 7.5–12.5)
Monocytes Relative: 6.1 %
Neutro Abs: 3699 cells/uL (ref 1500–7800)
Neutrophils Relative %: 68.5 %
Platelets: 206 10*3/uL (ref 140–400)
RBC: 4.5 10*6/uL (ref 3.80–5.10)
RDW: 13.2 % (ref 11.0–15.0)
Total Lymphocyte: 23.7 %
WBC: 5.4 10*3/uL (ref 3.8–10.8)

## 2020-07-04 LAB — COMPLETE METABOLIC PANEL WITH GFR
AG Ratio: 1.6 (calc) (ref 1.0–2.5)
ALT: 10 U/L (ref 6–29)
AST: 12 U/L (ref 10–35)
Albumin: 4.2 g/dL (ref 3.6–5.1)
Alkaline phosphatase (APISO): 80 U/L (ref 37–153)
BUN: 20 mg/dL (ref 7–25)
CO2: 26 mmol/L (ref 20–32)
Calcium: 9.2 mg/dL (ref 8.6–10.4)
Chloride: 105 mmol/L (ref 98–110)
Creat: 0.78 mg/dL (ref 0.50–0.99)
GFR, Est African American: 90 mL/min/{1.73_m2} (ref >=60)
GFR, Est Non African American: 78 mL/min/{1.73_m2} (ref >=60)
Globulin: 2.6 g/dL (calc) (ref 1.9–3.7)
Glucose, Bld: 91 mg/dL (ref 65–99)
Potassium: 4.3 mmol/L (ref 3.5–5.3)
Sodium: 140 mmol/L (ref 135–146)
Total Bilirubin: 0.5 mg/dL (ref 0.2–1.2)
Total Protein: 6.8 g/dL (ref 6.1–8.1)

## 2020-07-04 LAB — TSH: TSH: 3.33 mIU/L (ref 0.40–4.50)

## 2020-07-05 ENCOUNTER — Telehealth: Payer: Self-pay

## 2020-07-05 DIAGNOSIS — K5792 Diverticulitis of intestine, part unspecified, without perforation or abscess without bleeding: Secondary | ICD-10-CM

## 2020-07-05 MED ORDER — METRONIDAZOLE 500 MG PO TABS: 500.0000 mg | ORAL_TABLET | Freq: Three times a day (TID) | ORAL | 0 refills | Status: DC

## 2020-07-05 MED ORDER — CIPROFLOXACIN HCL 500 MG PO TABS: 500.0000 mg | ORAL_TABLET | Freq: Two times a day (BID) | ORAL | 0 refills | Status: DC

## 2020-07-05 NOTE — Telephone Encounter (Signed)
Please notify patient that I have sent both antibiotics that we have used before for her Diverticulitis to [facility].  Cipro + Flagyl.  Nobie Putnam, [facility] 07/05/2020, 11:27 AM

## 2020-07-05 NOTE — Telephone Encounter (Signed)
Patient is aware that the prescription has been sent.

## 2020-07-05 NOTE — Telephone Encounter (Signed)
Patient is still experiencing some abdominal pain and would like to go ahead and have the antibiotic sent into the pharmacy. Patient was seen on 06/28/2020 for this issues and declined medication at that time.

## 2020-07-05 NOTE — Addendum Note (Signed)
Addended by: Olin Hauser on: 07/05/2020 11:27 AM   Modules accepted: Orders

## 2020-07-10 ENCOUNTER — Other Ambulatory Visit: Payer: Self-pay

## 2020-07-10 ENCOUNTER — Other Ambulatory Visit: Payer: Self-pay | Admitting: Family Medicine

## 2020-07-10 ENCOUNTER — Ambulatory Visit (INDEPENDENT_AMBULATORY_CARE_PROVIDER_SITE_OTHER): Payer: Medicare HMO | Admitting: Family Medicine

## 2020-07-10 ENCOUNTER — Encounter: Payer: Self-pay | Admitting: Family Medicine

## 2020-07-10 VITALS — BP 119/52 | HR 88 | Ht 66.0 in | Wt 176.2 lb

## 2020-07-10 DIAGNOSIS — K5792 Diverticulitis of intestine, part unspecified, without perforation or abscess without bleeding: Secondary | ICD-10-CM

## 2020-07-10 DIAGNOSIS — Z1231 Encounter for screening mammogram for malignant neoplasm of breast: Secondary | ICD-10-CM

## 2020-07-10 DIAGNOSIS — Z1211 Encounter for screening for malignant neoplasm of colon: Secondary | ICD-10-CM

## 2020-07-10 DIAGNOSIS — K5900 Constipation, unspecified: Secondary | ICD-10-CM | POA: Diagnosis not present

## 2020-07-10 DIAGNOSIS — E663 Overweight: Secondary | ICD-10-CM | POA: Diagnosis not present

## 2020-07-10 DIAGNOSIS — E785 Hyperlipidemia, unspecified: Secondary | ICD-10-CM

## 2020-07-10 DIAGNOSIS — Z0000 Encounter for general adult medical examination without abnormal findings: Secondary | ICD-10-CM | POA: Diagnosis not present

## 2020-07-10 DIAGNOSIS — Z780 Asymptomatic menopausal state: Secondary | ICD-10-CM

## 2020-07-10 MED ORDER — ROSUVASTATIN CALCIUM 5 MG PO TABS: 5.0000 mg | ORAL_TABLET | Freq: Every day | ORAL | 3 refills | Status: DC

## 2020-07-10 NOTE — Progress Notes (Signed)
Subjective:    Patient ID: Julie Pena, female    DOB: 14-Jul-1950, 70 y.o.   MRN: [id number]  Julie Pena is a 70 y.o. female presenting on 07/10/2020 for Annual Exam   HPI  Here for Annual Physical and Lab Review.  History of endometriosis/ Postmenopausal Estrogen Deficiency S/p partial hysterectomy and then 2nd complete Postmenopausal Estrogen Deficiency She has been on chronic HRT,Premarin, now down to HALF of0.5 one pillevery 3 days, trying to wean off in future.  PMH - Diverticulosis - last flare up 06/2018  History of recurrent UTI vs Asymptomatic Bacteruria Chronic problem.  Currently asymptomatic. History previuosly of recurrent UTI, req bladder dilatation as child Now she describes intermittent episodes of cloudy urine with some odor, seems come and go, without any dysuria, hematuria, pelvic or flank pain, urgency frequency Request urine sample today Previous history E Coli UTI with resistances.  LIFESTYLE / WELLNESS / Overweight BMI >28 Weight down 16 lbs - Diet is balanced now reduced portion sizes, and she avoids snacking and any carbs between meals, drinks a lot of water One tablespoon daily, apple cider vinegar with mother  HYPERLIPIDEMIA: - Reports no concerns. Last lipid panel 06/2020, mild elevated TG, LDL Not on any medication Lifestyle - Diet: admits not following low cholesterol diet.   Additional history  History of recurrent Diverticulitis Chronic problem with diverticulitis She uses generic Miralax regularly  Umbilical Hernia Chronic problem, not causing any abdominal pain or complication. Reports increased bulging periumbilical superior aspect. She will notify when ready for consult from surgery.    Health Maintenance:  COVID declines.  Bone Mineral Density - DUE for initial DEXA, will order.  Cervical CA Screening: S/p initial partial hysterectomy then total hysterectomy including removal of cervix. She had pap smear still  completed by Dr Luan Pulling, reportedly normal. No prior abnormal screening, now age >65 without cervix  Breast CA Screening:  Due for mammogram screening. Last mammogram 2020, negative, asymptomatic. Due for repeat. No abnormal and no fam history  Colon CA Screening:  Last Colonoscopy approximately 2012-2013, unable to receive the report. Currently asymptomatic. No known family history of colon CA. Last Cologuard 11/22/2018 negative.  Due for initial pneumonia vaccine at age>65 -she deferred Prevnar-13 and will check with pharmacy when ready.  Depression screen [facility] 2/9 07/11/2019 11/10/2018 06/29/2018  Decreased Interest 0 0 0  Down, Depressed, Hopeless 0 0 0  PHQ - 2 Score 0 0 0    Past Medical History:  Diagnosis Date  . Diverticulosis    Past Surgical History:  Procedure Laterality Date  . ABDOMINAL HYSTERECTOMY  1988   Initial partial 1978, then repeat TOTAL 1988  . BREAST EXCISIONAL BIOPSY Right mid 1980s   benign  . CATARACT EXTRACTION Bilateral 2018  . DENTAL SURGERY  04/2019  . OOPHORECTOMY    . TONSILLECTOMY Bilateral 1963   Social History   Socioeconomic History  . Marital status: Married    Spouse name: Julie Pena  . Number of children: Not on file  . Years of education: Not on file  . Highest education level: Not on file  Occupational History  . Occupation: retired  Tobacco Use  . Smoking status: Current Some Day Smoker    Packs/day: 0.50    Years: 47.00    Pack years: 23.50    Types: Cigarettes  . Smokeless tobacco: Current User  . Tobacco comment: enrolled in smoking cessation class starting 08/2019  Vaping Use  . Vaping Use: Never used  Substance and Sexual Activity  . Alcohol use: Never  . Drug use: Never  . Sexual activity: Not on file  Other Topics Concern  . Not on file  Social History Narrative  . Not on file   Social Determinants of Health   Financial Resource Strain: Not on file  Food Insecurity: Not on file  Transportation  Needs: Not on file  Physical Activity: Not on file  Stress: Not on file  Social Connections: Not on file  Intimate Partner Violence: Not on file   Family History  Problem Relation Age of Onset  . Cancer Mother        kidney  . Kidney cancer Mother   . Cancer Father        lung  . Alcohol abuse Father   . Depression Father   . Lung cancer Father   . Colon cancer Neg Hx   . Breast cancer Neg Hx    Current Outpatient Medications on File Prior to Visit  Medication Sig  . ciprofloxacin (CIPRO) 500 MG tablet Take 1 tablet (500 mg total) by mouth 2 (two) times daily. One po bid x 7 days  . metroNIDAZOLE (FLAGYL) 500 MG tablet Take 1 tablet (500 mg total) by mouth 3 (three) times daily.  . Polyethylene Glycol 3350 (MIRALAX PO) Take by mouth.  . fexofenadine-pseudoephedrine (ALLEGRA-D 24) 180-240 MG 24 hr tablet Take 1 tablet by mouth daily. (Patient not taking: Reported on 07/10/2020)  . Probiotic Product (ALIGN PO) Take by mouth. Take probiotic daily (Patient not taking: Reported on 07/10/2020)   No current facility-administered medications on file prior to visit.    Review of Systems  Constitutional: Negative for activity change, appetite change, chills, diaphoresis, fatigue and fever.  HENT: Negative for congestion and hearing loss.   Eyes: Negative for visual disturbance.  Respiratory: Negative for cough, chest tightness, shortness of breath and wheezing.   Cardiovascular: Negative for chest pain, palpitations and leg swelling.  Gastrointestinal: Negative for abdominal pain, constipation, diarrhea, nausea and vomiting.  Genitourinary: Negative for dysuria, frequency and hematuria.  Musculoskeletal: Negative for arthralgias and neck pain.  Skin: Negative for rash.  Neurological: Negative for dizziness, weakness, light-headedness, numbness and headaches.  Hematological: Negative for adenopathy.  Psychiatric/Behavioral: Negative for behavioral problems, dysphoric mood and sleep  disturbance.   Per HPI unless specifically indicated above      Objective:    BP (!) 119/52   Pulse 88   Ht 5\' 6"  (1.676 m)   Wt 176 lb 3.2 oz (79.9 kg)   SpO2 100%   BMI 28.44 kg/m   Wt Readings from Last 3 Encounters:  07/10/20 176 lb 3.2 oz (79.9 kg)  06/28/20 173 lb 8 oz (78.7 kg)  02/07/20 178 lb 9.6 oz (81 kg)    Physical Exam Vitals and nursing note reviewed.  Constitutional:      General: She is not in acute distress.    Appearance: She is well-developed. She is not diaphoretic.     Comments: Well-appearing, comfortable, cooperative  HENT:     Head: Normocephalic and atraumatic.  Eyes:     General:        Right eye: No discharge.        Left eye: No discharge.     Conjunctiva/sclera: Conjunctivae normal.  Neck:     Thyroid: No thyromegaly.  Cardiovascular:     Rate and Rhythm: Normal rate and regular rhythm.     Heart sounds: Normal heart sounds. No murmur heard.  Pulmonary:     Effort: Pulmonary effort is normal. No respiratory distress.     Breath sounds: Normal breath sounds. No wheezing or rales.  Abdominal:     General: There is no distension.     Palpations: Abdomen is soft. There is no mass.     Tenderness: There is no abdominal tenderness. There is no guarding or rebound.     Hernia: A hernia (umbilical hernia) is present.     Comments: Slow bowel sounds  Musculoskeletal:        General: Normal range of motion.     Cervical back: Normal range of motion and neck supple.  Lymphadenopathy:     Cervical: No cervical adenopathy.  Skin:    General: Skin is warm and dry.     Findings: Lesion (soft tissue swelling localized 2-3 cm, no diffinitive nodule, non tender, left anterior shoulder clavicular area) present. No erythema or rash.  Neurological:     Mental Status: She is alert and oriented to person, place, and time.  Psychiatric:        Behavior: Behavior normal.     Comments: Well groomed, good eye contact, normal speech and thoughts     Results for orders placed or performed in visit on 07/02/20  Lipid panel  Result Value Ref Range   Cholesterol 188 <200 mg/dL   HDL 43 (L) > OR = 50 mg/dL   Triglycerides 183 (H) <150 mg/dL   LDL Cholesterol (Calc) 115 (H) mg/dL (calc)   Total CHOL/HDL Ratio 4.4 <5.0 (calc)   Non-HDL Cholesterol (Calc) 145 (H) <130 mg/dL (calc)  COMPLETE METABOLIC PANEL WITH GFR  Result Value Ref Range   Glucose, Bld 91 65 - 99 mg/dL   BUN 20 7 - 25 mg/dL   Creat 0.78 0.50 - 0.99 mg/dL   GFR, Est Non African American 78 > OR = 60 mL/min/1.64m2   GFR, Est African American 90 > OR = 60 mL/min/1.46m2   BUN/Creatinine Ratio NOT APPLICABLE 6 - 22 (calc)   Sodium 140 135 - 146 mmol/L   Potassium 4.3 3.5 - 5.3 mmol/L   Chloride 105 98 - 110 mmol/L   CO2 26 20 - 32 mmol/L   Calcium 9.2 8.6 - 10.4 mg/dL   Total Protein 6.8 6.1 - 8.1 g/dL   Albumin 4.2 3.6 - 5.1 g/dL   Globulin 2.6 1.9 - 3.7 g/dL (calc)   AG Ratio 1.6 1.0 - 2.5 (calc)   Total Bilirubin 0.5 0.2 - 1.2 mg/dL   Alkaline phosphatase (APISO) 80 37 - 153 U/L   AST 12 10 - 35 U/L   ALT 10 6 - 29 U/L  CBC with Differential/Platelet  Result Value Ref Range   WBC 5.4 3.8 - 10.8 Thousand/uL   RBC 4.50 3.80 - 5.10 Million/uL   Hemoglobin 13.8 11.7 - 15.5 g/dL   HCT 41.5 35.0 - 45.0 %   MCV 92.2 80.0 - 100.0 fL   MCH 30.7 27.0 - 33.0 pg   MCHC 33.3 32.0 - 36.0 g/dL   RDW 13.2 11.0 - 15.0 %   Platelets 206 140 - 400 Thousand/uL   MPV 9.9 7.5 - 12.5 fL   Neutro Abs 3,699 1,500 - 7,800 cells/uL   Lymphs Abs 1,280 850 - 3,900 cells/uL   Absolute Monocytes 329 200 - 950 cells/uL   Eosinophils Absolute 70 15 - 500 cells/uL   Basophils Absolute 22 0 - 200 cells/uL   Neutrophils Relative % 68.5 %   Total  Lymphocyte 23.7 %   Monocytes Relative 6.1 %   Eosinophils Relative 1.3 %   Basophils Relative 0.4 %  TSH  Result Value Ref Range   TSH 3.33 0.40 - 4.50 mIU/L      Assessment & Plan:   Problem List Items Addressed This Visit     Overweight (BMI 25.0-29.9)   Dyslipidemia    Mostly controlled cholesterol, not on statin. Mild elevated ASCVD risk w/ smoking Last lipid panel 07/2020 The 10-year ASCVD risk score Mikey Bussing DC Jr., et al., 2013) is: 12.7%  Plan: 1. Discussionon ASCVD risk reduction - START new Statin therapy - Rosuvastatin 5mg  nightly - counsel on benefit risk side effects 2. Encourage improved lifestyle - low carb/cholesterol, reduce portion size, continue improving regular exercise  F/u 3 months chemistry / lipid results      Relevant Medications   rosuvastatin (CRESTOR) 5 MG tablet   Diverticulitis   Relevant Orders   Ambulatory referral to Gastroenterology    Other Visit Diagnoses    Annual physical exam    -  Primary   Encounter for screening mammogram for malignant neoplasm of breast       Relevant Orders   MM 3D SCREEN BREAST BILATERAL   Postmenopausal estrogen deficiency       Relevant Orders   DG Bone Density   Constipation, unspecified constipation type       Relevant Orders   Ambulatory referral to Gastroenterology   Screen for colon cancer       Relevant Orders   Ambulatory referral to Gastroenterology      Updated Health Maintenance information Recommend COVID Vaccine Order initial DEXA BMD screening [place] Ordered 3D Mammogram Screening Due for initial pneumonia vaccine prevnar-13 vs new version, she can check w/ pharmacy  Reviewed recent lab results with patient Encouraged improvement to lifestyle with diet and exercise - Goal of weight loss  #Diverticulitis, chronic for chronic recurrent diverticulitis, has recurrent flares responds to antibiotics cipro flagyl, usually dietary triggers, has some mixture of IBS-like symptoms as well, last colonoscopy approx 2012 or 2013, no report available, last cologuard done 11/2018 was negative  Referral to [facility] - evaluation and management - possibly repeat colonoscopy and can screen for colon CA / eval diverticulitis  Orders Placed This  Encounter  Procedures  . DG Bone Density    Standing Status:   Future    Standing Expiration Date:   07/10/2021    Order Specific Question:   Reason for Exam (SYMPTOM  OR DIAGNOSIS REQUIRED)    Answer:   postmenopausal estrogen deficiency screening osteoporosis    Order Specific Question:   Preferred imaging location?    Answer:   [facility]  . MM 3D SCREEN BREAST BILATERAL    Standing Status:   Future    Standing Expiration Date:   07/10/2021    Order Specific Question:   Reason for Exam (SYMPTOM  OR DIAGNOSIS REQUIRED)    Answer:   Screening bilateral 3D Mammogram Tomo    Order Specific Question:   Preferred imaging location?    Answer:   [facility]  . Ambulatory referral to Gastroenterology    Referral Priority:   Routine    Referral Type:   Consultation    Referral Reason:   Specialty Services Required    Number of Visits Requested:   1     Meds ordered this encounter  Medications  . rosuvastatin (CRESTOR) 5 MG tablet    Sig: Take 1 tablet (5 mg  total) by mouth at bedtime.    Dispense:  90 tablet    Refill:  3      Follow up plan: Return in about 3 months (around 10/09/2020) for 3 month fasting lab only then 1 week later Follow-up Cholesterol, results..   Future labs for 10/02/20 - CMET + Lipid 3 months.   Nobie Putnam, Julie Pena 07/10/2020, 2:02 PM

## 2020-07-10 NOTE — Patient Instructions (Addendum)
Thank you for coming to the office today.  Recommend Prevnar-13 Pneumonia Vaccine as the initial age 70+ pneumonia vaccine, then 1 year later can get Pneumovax-23 2nd dose and done.  You can ask pharmacy about Prevnar-20 new updated first shot.  For Mammogram screening for breast cancer and DEXA Scan (Bone mineral density) screening for osteoporosis  Call the [facility] below anytime to schedule your own appointment now that order has been placed.  [facility] [place], [place] [address] Phone: [phone]  --------------------------------------  You are at increased risk of future Cardiovascular complications such as Heart Attack or Stroke from an artery blockage due to abnormal cholesterol and/or risk factors. - As discussed, Statin Cholesterol pills both can both LOWER cholesterol and REDUCE this future risk of heart attack and stroke - Start Rosuvastatin (generic Crestor) 5mg  pill once at bedtime every night  If you develop mild aches or pains in muscle or joint that does NOT improve or go away after first 3-4 weeks then this may require [place] to adjust the dose. First I would recommend STOPPING the medication for a few weeks until your ache and pain symptoms completely RESOLVE. Then you can restart at a LOWER DOSE either HALF a pill at bedtime every night or LESS OFTEN such as one pill a week only and then gradually increase to every other day or max dose of 3 times a week  Lastly, sometimes we need to try other versions of this medicine to find one that works for you and does not cause side effects.  Referral to [facility] for Colonoscopy / eval for diverticulitis  [facility]) [place], [place] [address] Phone: [phone]  DUE for [place] (no food or drink after midnight before the lab appointment, only water or coffee without cream/sugar on the  morning of)  SCHEDULE "Lab Only" visit in the morning at the clinic for lab draw in 3 MONTHS   - Make sure Lab Only appointment is at about 1 week before your next appointment, so that results will be available  For Lab Results, once available within 2-3 days of blood draw, you can can log in to MyChart online to view your results and a brief explanation. Also, we can discuss results at next follow-up visit.     Please schedule a Follow-up Appointment to: Return in about 3 months (around 10/09/2020) for 3 month fasting lab only then 1 week later Follow-up Cholesterol, results..  If you have any other questions or concerns, please feel free to call the office or send a message through [place]. You may also schedule an earlier appointment if necessary.  Additionally, you may be receiving a survey about your experience at our office within a few days to 1 week by e-mail or mail. We value your feedback.  Nobie Putnam, DO [place]

## 2020-07-10 NOTE — Assessment & Plan Note (Signed)
Mostly controlled cholesterol, not on statin. Mild elevated ASCVD risk w/ smoking Last lipid panel 07/2020 The 10-year ASCVD risk score Julie Bussing DC Jr., et al., 2013) is: 12.7%  Plan: 1. Discussionon ASCVD risk reduction - START new Statin therapy - Rosuvastatin 5mg  nightly - counsel on benefit risk side effects 2. Encourage improved lifestyle - low carb/cholesterol, reduce portion size, continue improving regular exercise  F/u 3 months chemistry / lipid results

## 2020-07-15 ENCOUNTER — Encounter: Payer: Self-pay | Admitting: *Deleted

## 2020-08-01 ENCOUNTER — Other Ambulatory Visit: Payer: Self-pay

## 2020-08-01 ENCOUNTER — Ambulatory Visit
Admission: RE | Admit: 2020-08-01 | Discharge: 2020-08-01 | Disposition: A | Discharge status: Home / Self Care | Payer: Medicare HMO | Source: Ambulatory Visit | Attending: Family Medicine | Admitting: Family Medicine

## 2020-08-01 DIAGNOSIS — Z780 Asymptomatic menopausal state: Secondary | ICD-10-CM

## 2020-08-01 DIAGNOSIS — Z1231 Encounter for screening mammogram for malignant neoplasm of breast: Secondary | ICD-10-CM | POA: Diagnosis not present

## 2020-08-21 ENCOUNTER — Telehealth: Payer: Self-pay | Admitting: Family Medicine

## 2020-08-21 NOTE — Telephone Encounter (Signed)
Copied from [place] [id number]. Topic: Medicare AWV >> Aug 21, 2020  3:12 PM Cher Nakai R wrote: Reason for CRM:  Left message for patient to call back and schedule the Medicare Annual Wellness Visit (AWV) virtually or by telephone.  Last AWV 07/11/2019  Please schedule at anytime with [place].  40 minute appointment  Any questions, please call me at [phone]

## 2020-08-26 ENCOUNTER — Other Ambulatory Visit: Payer: Self-pay

## 2020-08-26 ENCOUNTER — Ambulatory Visit: Payer: Medicare HMO | Admitting: Gastroenterology

## 2020-08-26 ENCOUNTER — Encounter: Payer: Self-pay | Admitting: Gastroenterology

## 2020-08-26 VITALS — BP 139/65 | HR 98 | Temp 98.5°F | Ht 66.0 in | Wt 175.2 lb

## 2020-08-26 DIAGNOSIS — Z1211 Encounter for screening for malignant neoplasm of colon: Secondary | ICD-10-CM

## 2020-08-26 DIAGNOSIS — K640 First degree hemorrhoids: Secondary | ICD-10-CM

## 2020-08-26 DIAGNOSIS — Z8719 Personal history of other diseases of the digestive system: Secondary | ICD-10-CM | POA: Diagnosis not present

## 2020-08-26 MED ORDER — NA SULFATE-K SULFATE-MG SULF 17.5-3.13-1.6 GM/177ML PO SOLN: 354.0000 mL | Freq: Once | ORAL | 0 refills | Status: AC

## 2020-08-26 NOTE — Progress Notes (Signed)
[place], [place]  [address]  [place], [place] [address]  Main: [phone]  Fax: [phone]    Gastroenterology Consultation  Referring Provider:     Nobie Putnam * Primary Care Physician:  Olin Hauser, DO Primary Gastroenterologist:  Dr. Cephas Darby Reason for Consultation:     History of recurrent diverticulitis, symptomatic hemorrhoids        HPI:   Julie Pena is a 70 y.o. female referred by Dr. Parks Ranger, Devonne Doughty, DO  for consultation & management of history of recurrent diverticulitis.  Patient reports that she has been experiencing recurrent episodes of acute lower abdominal pain about twice a year triggered after few days of constipation.  She was being treated with Cipro as needed by her PCP based on the clinical diagnosis of acute diverticulitis.  Most recently, she had a combination of Cipro and Flagyl several months ago.  She generally has regular bowel movements with occasional constipation.  She does smoke tobacco.  She would like to discuss about colonoscopy, her last colonoscopy was several years ago.  She had Cologuard negative in 2020.  Her labs are otherwise normal.  Patient denies any rectal bleeding, no evidence of anemia  Patient reports that she has been experiencing hemorrhoidal symptoms over several years ever since she had kids.  Her symptoms are mostly pain, swelling and discomfort.  The symptoms get worse during attacks of acute diverticulitis.  She uses over-the-counter hemorrhoidal creams which provides some relief.  NSAIDs: None  Antiplts/Anticoagulants/Anti thrombotics: None  GI Procedures: Colonoscopy several years ago Patient denies any family history of GI malignancy  Past Medical History:  Diagnosis Date  . Diverticulosis     Past Surgical History:  Procedure Laterality Date  . ABDOMINAL HYSTERECTOMY  1988   Initial partial 1978, then repeat TOTAL 1988  . BREAST EXCISIONAL BIOPSY  Right mid 1980s   benign  . CATARACT EXTRACTION Bilateral 2018  . DENTAL SURGERY  04/2019  . OOPHORECTOMY    . TONSILLECTOMY Bilateral 1963     Family History  Problem Relation Age of Onset  . Cancer Mother        kidney  . Kidney cancer Mother   . Cancer Father        lung  . Alcohol abuse Father   . Depression Father   . Lung cancer Father   . Colon cancer Neg Hx   . Breast cancer Neg Hx      Social History   Tobacco Use  . Smoking status: Current Some Day Smoker    Packs/day: 0.50    Years: 47.00    Pack years: 23.50    Types: Cigarettes  . Smokeless tobacco: Current User  . Tobacco comment: enrolled in smoking cessation class starting 08/2019  Vaping Use  . Vaping Use: Never used  Substance Use Topics  . Alcohol use: Never  . Drug use: Never    Allergies as of 08/26/2020 - Review Complete 08/26/2020  Allergen Reaction Noted  . Codeine Hives 08/11/2017    Review of Systems:    All systems reviewed and negative except where noted in HPI.   Physical Exam:  BP 139/65 (BP Location: Left Arm, Patient Position: Sitting, Cuff Size: Normal)   Pulse 98   Temp 98.5 F (36.9 C) (Oral)   Ht 5\' 6"  (1.676 m)   Wt 175 lb 4 oz (79.5 kg)   BMI 28.29 kg/m  No LMP recorded. Patient has had  a hysterectomy.  General:   Alert,  Well-developed, well-nourished, pleasant and cooperative in NAD Head:  Normocephalic and atraumatic. Eyes:  Sclera clear, no icterus.   Conjunctiva pink. Ears:  Normal auditory acuity. Nose:  No deformity, discharge, or lesions. Mouth:  No deformity or lesions,oropharynx pink & moist. Neck:  Supple; no masses or thyromegaly. Lungs:  Respirations even and unlabored.  Clear throughout to auscultation.   No wheezes, crackles, or rhonchi. No acute distress. Heart:  Regular rate and rhythm; no murmurs, clicks, rubs, or gallops. Abdomen:  Normal bowel sounds. Soft, non-tender and non-distended without masses, hepatosplenomegaly or hernias noted.  No  guarding or rebound tenderness.   Rectal: Not performed Msk:  Symmetrical without gross deformities. Good, equal movement & strength bilaterally. Pulses:  Normal pulses noted. Extremities:  No clubbing or edema.  No cyanosis. Neurologic:  Alert and oriented x3;  grossly normal neurologically. Skin:  Intact without significant lesions or rashes. No jaundice. Psych:  Alert and cooperative. Normal mood and affect.  Imaging Studies: No abdominal imaging  Assessment and Plan:   Julie Pena is a 70 y.o. female with history of tobacco use is seen in consultation for history of recurrent uncomplicated diverticulitis  Recurrent uncomplicated diverticulitis Discussed with patient that the role of antibiotics in acute uncomplicated diverticulitis is uncertain.  Advised her to avoid processed foods, red meat, keep her bowels regular, stay hydrated and cut back on smoking in order to prevent recurrent attacks of diverticulitis  Colon cancer screening Recommend screening colonoscopy  Symptomatic hemorrhoids Discussed about hemorrhoid ligation after the colonoscopy Information provided   Follow up as needed   Cephas Darby, MD

## 2020-08-31 IMAGING — MG MM DIGITAL DIAGNOSTIC UNILAT*L* W/ TOMO W/ CAD
4 series · 4 of 12 positions shown · non-contrast
Comparison: Previous exam(s).

CLINICAL DATA: Screening recall for possible left breast mass.

EXAM:
DIGITAL DIAGNOSTIC LEFT MAMMOGRAM WITH CAD AND TOMO
ULTRASOUND LEFT BREAST

[L CC synth-2D]
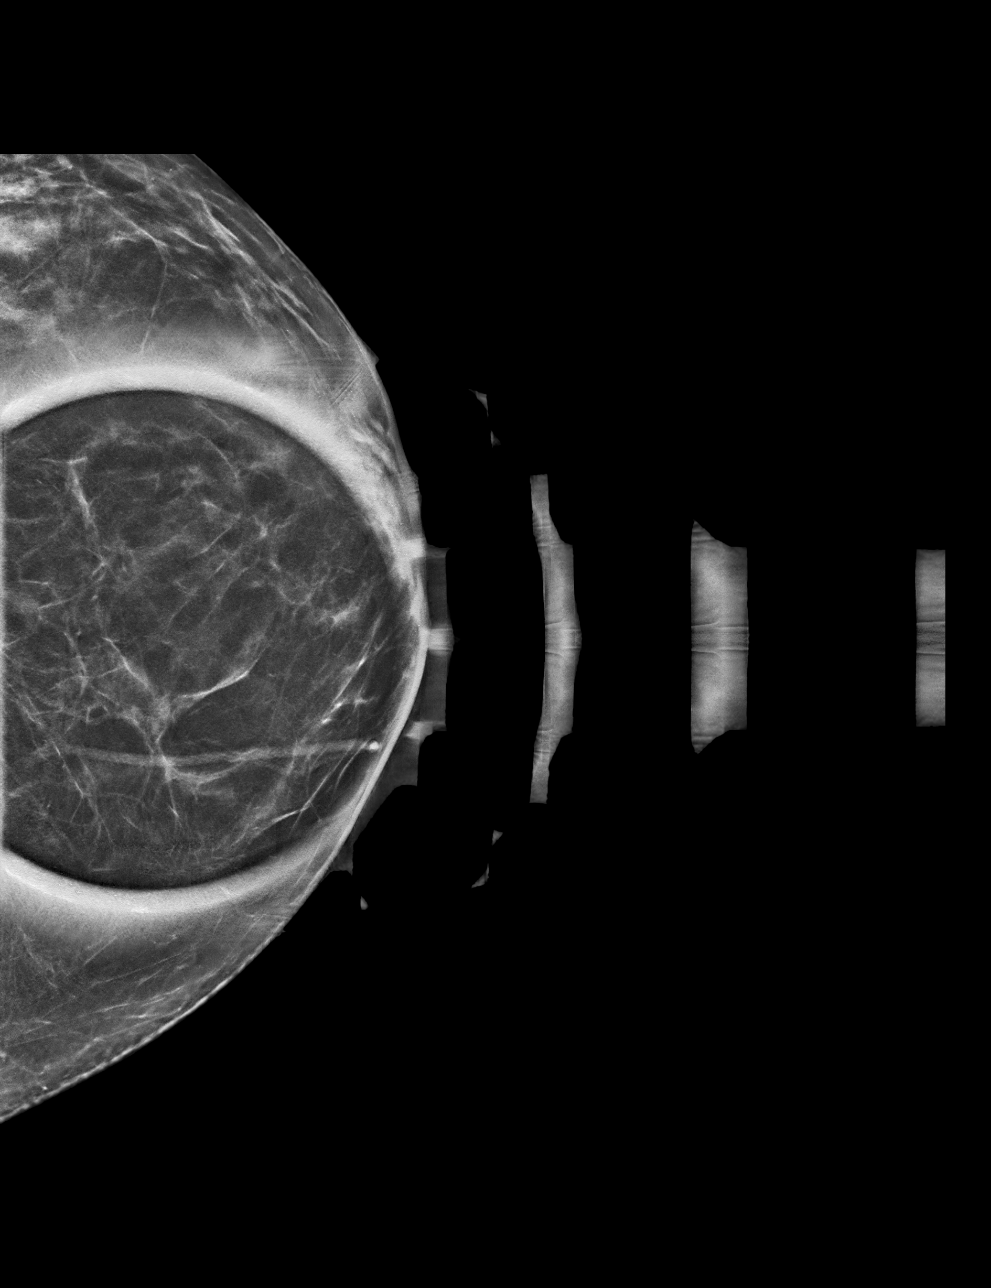

[L MLO synth-2D]
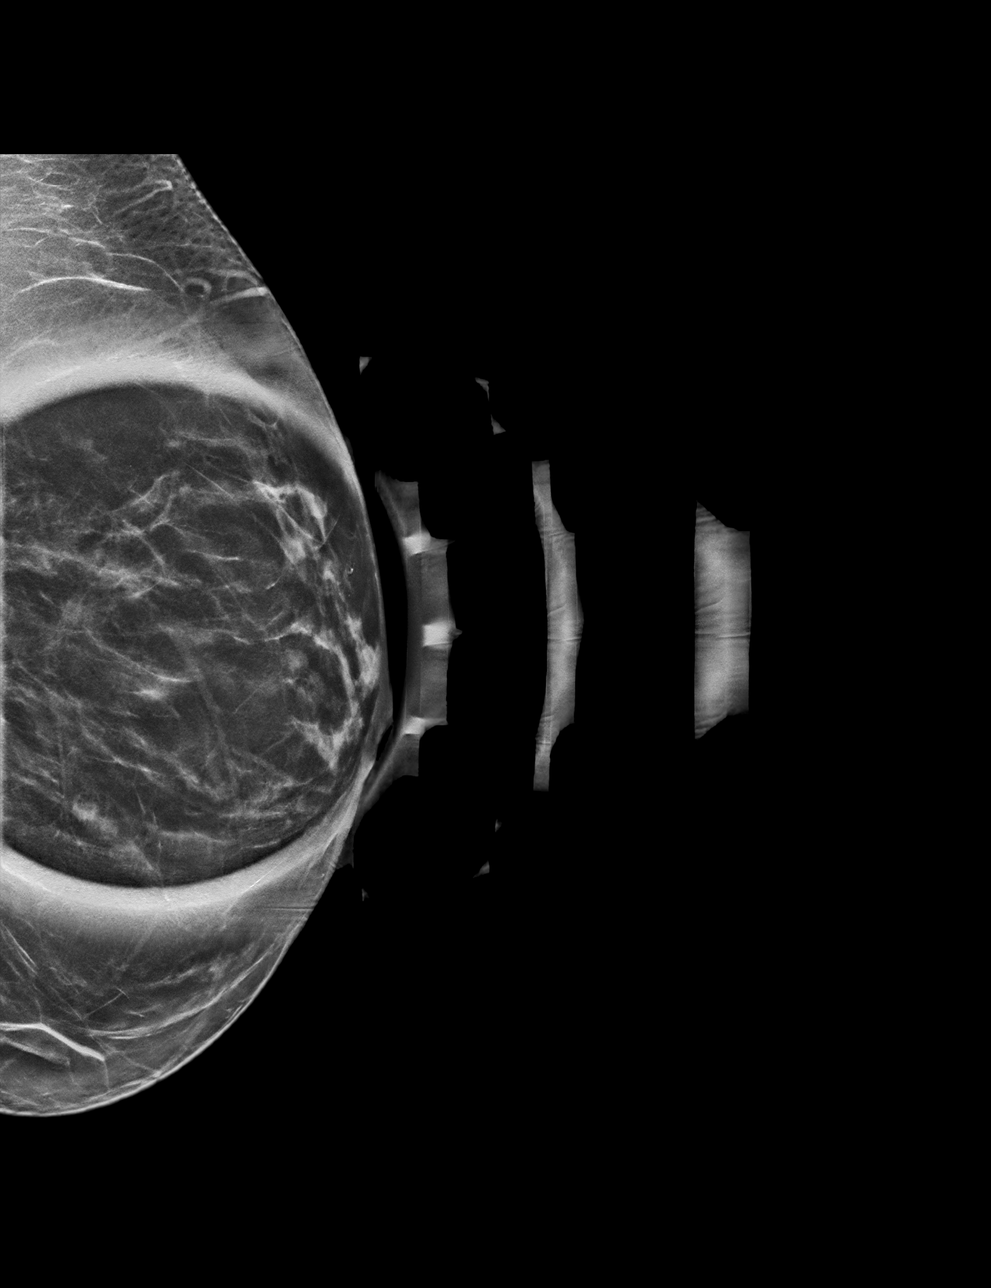

[L MLO tomo · tomo slice 29/56.0]
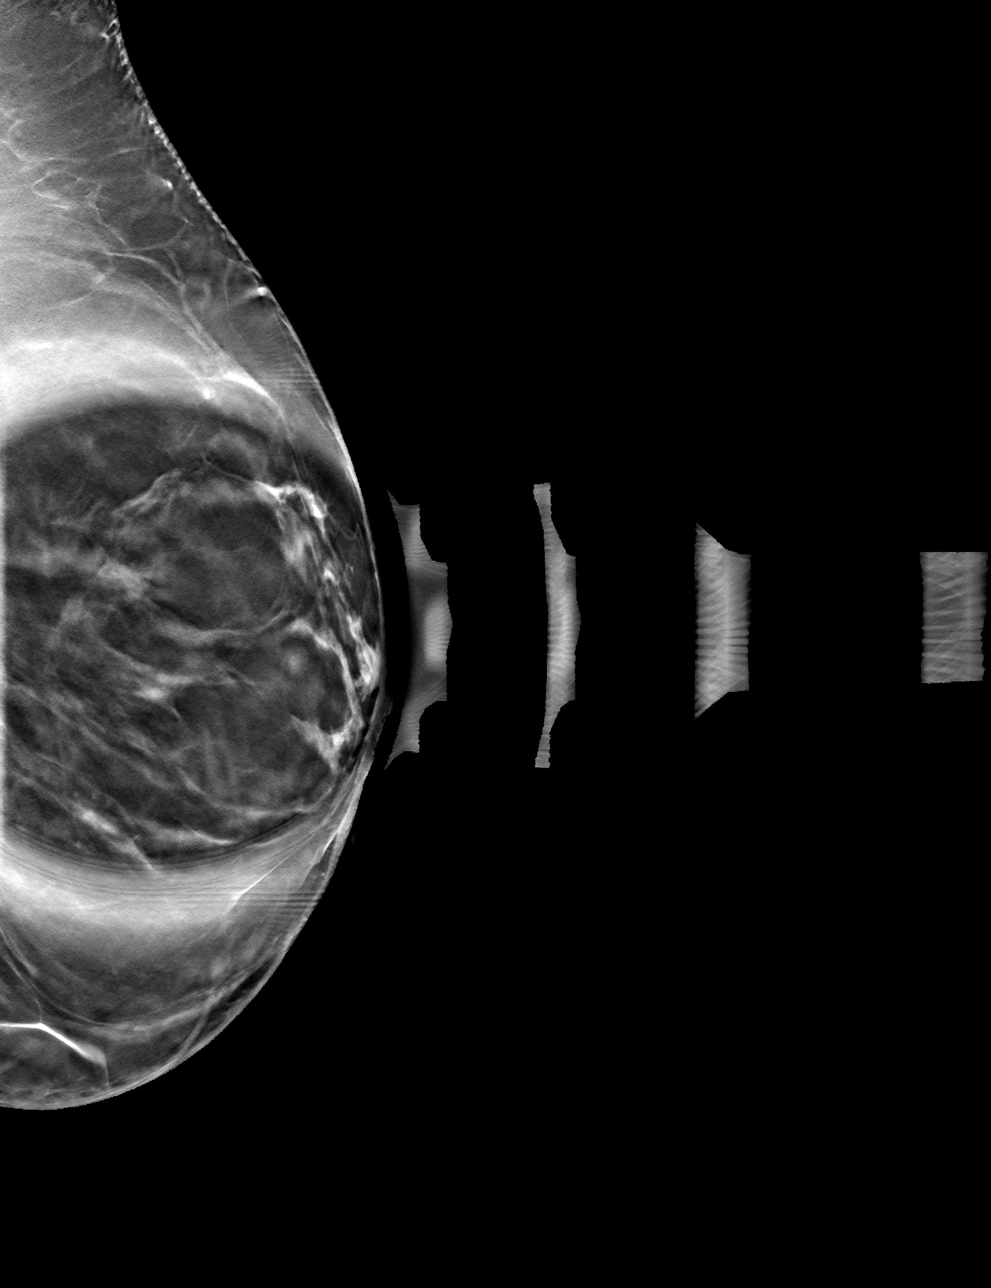

[L CC tomo · tomo slice 18/35.0]
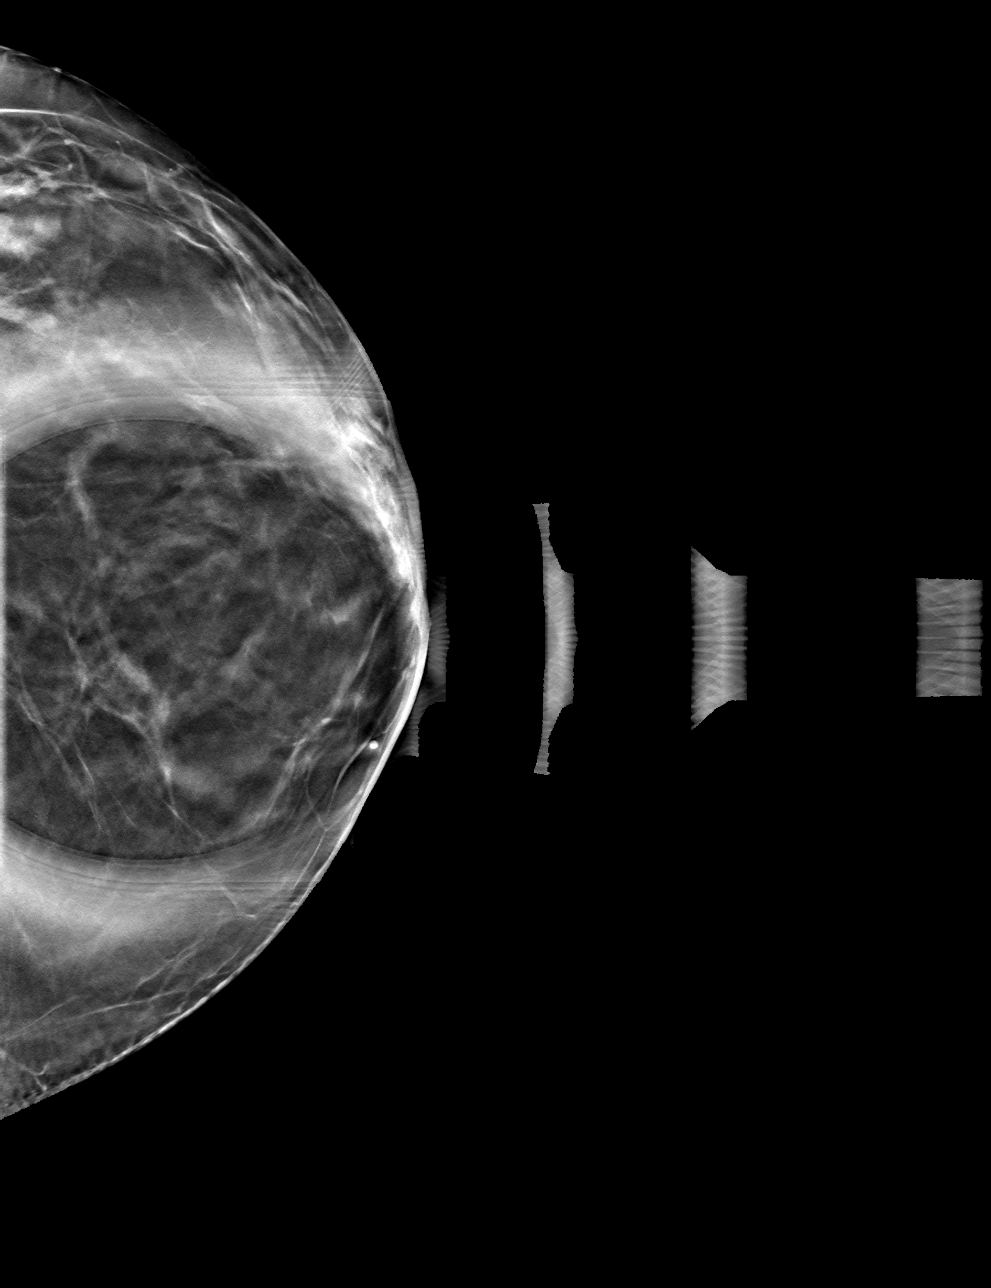

[4 of 12 positions shown; findings below may reference images not displayed]

ACR Breast Density Category b: There are scattered areas of
fibroglandular density.
FINDINGS: The possible mass noted in the anterior left breast on the screening
study persists on the diagnostic images. Mass lies near the 12
o'clock retroareolar region. It is approximately 5 mm in size with
mildly lobulated but circumscribed margins.

Mammographic images were processed with CAD.

Targeted ultrasound is performed, showing a small cyst in the left
breast at 10 o'clock, 2 cm the nipple, anterior depth, measuring 4 x
3 x 4 mm, consistent in size, shape and location to the mammographic
mass. There are no solid masses or suspicious lesions.
IMPRESSION: 1. No evidence of breast malignancy.
2. Small benign left breast cyst.

RECOMMENDATION:
Screening mammogram in one year.(Code:Q1-K-RC6)

I have discussed the findings and recommendations with the patient.
If applicable, a reminder letter will be sent to the patient
regarding the next appointment.

BI-RADS CATEGORY  2: Benign.

## 2020-08-31 IMAGING — US US BREAST*L* LIMITED INC AXILLA
1 series · 5 of 5 positions shown · non-contrast
Comparison: Previous exam(s).

CLINICAL DATA: Screening recall for possible left breast mass.

EXAM:
DIGITAL DIAGNOSTIC LEFT MAMMOGRAM WITH CAD AND TOMO
ULTRASOUND LEFT BREAST

[Series 1: us breast*left* limited inc axilla · 0.04mm/px · 5 of 5 slices shown]
[im 1/5]
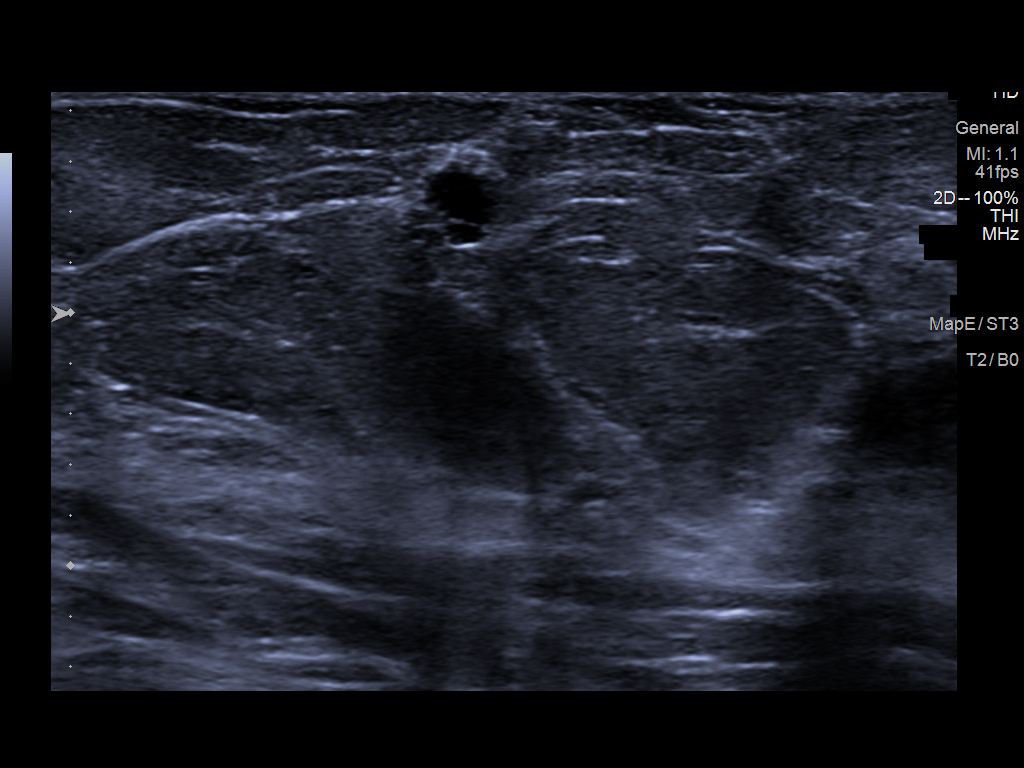
[im 2/5]
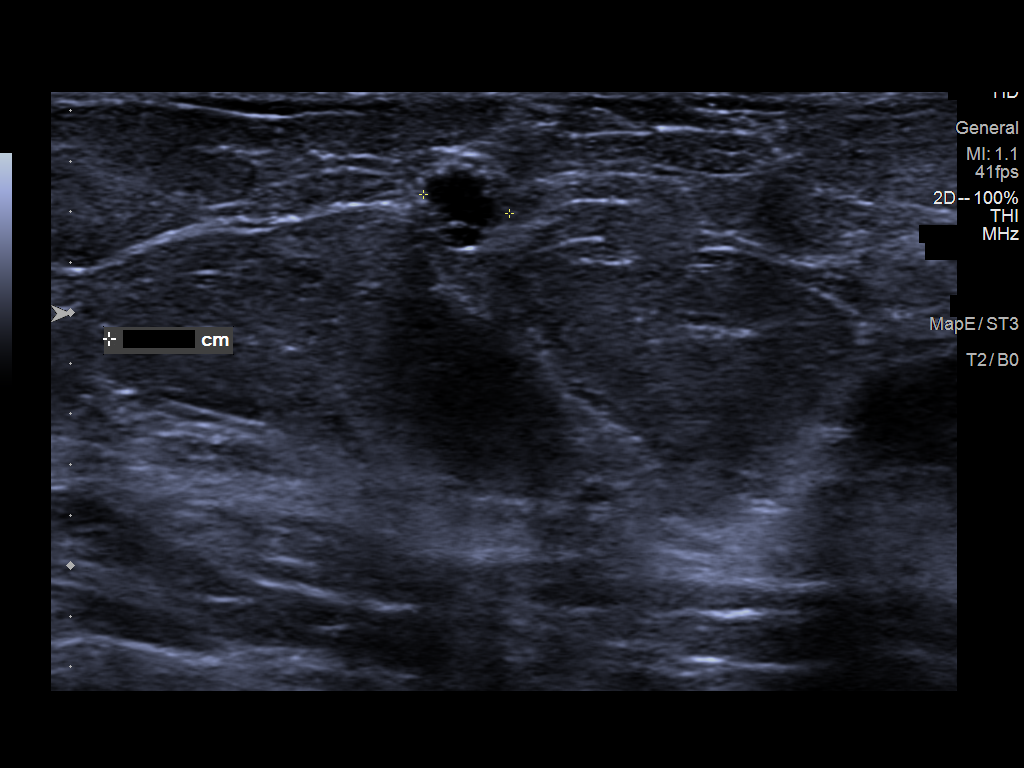
[im 3/5]
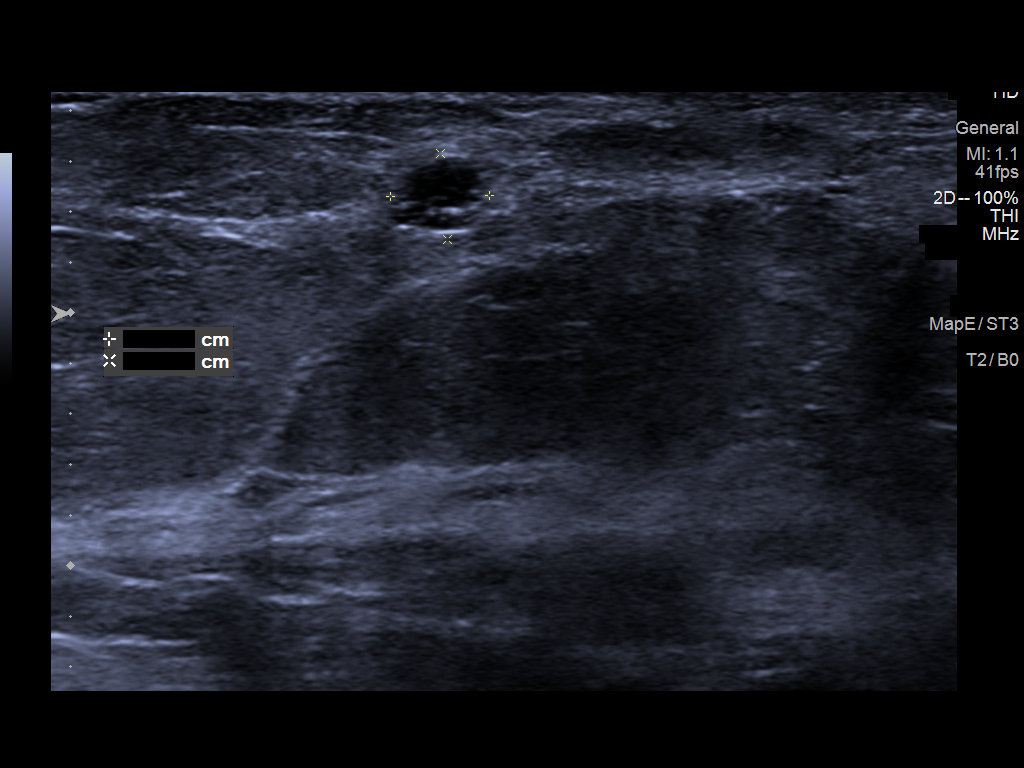
[im 4/5]
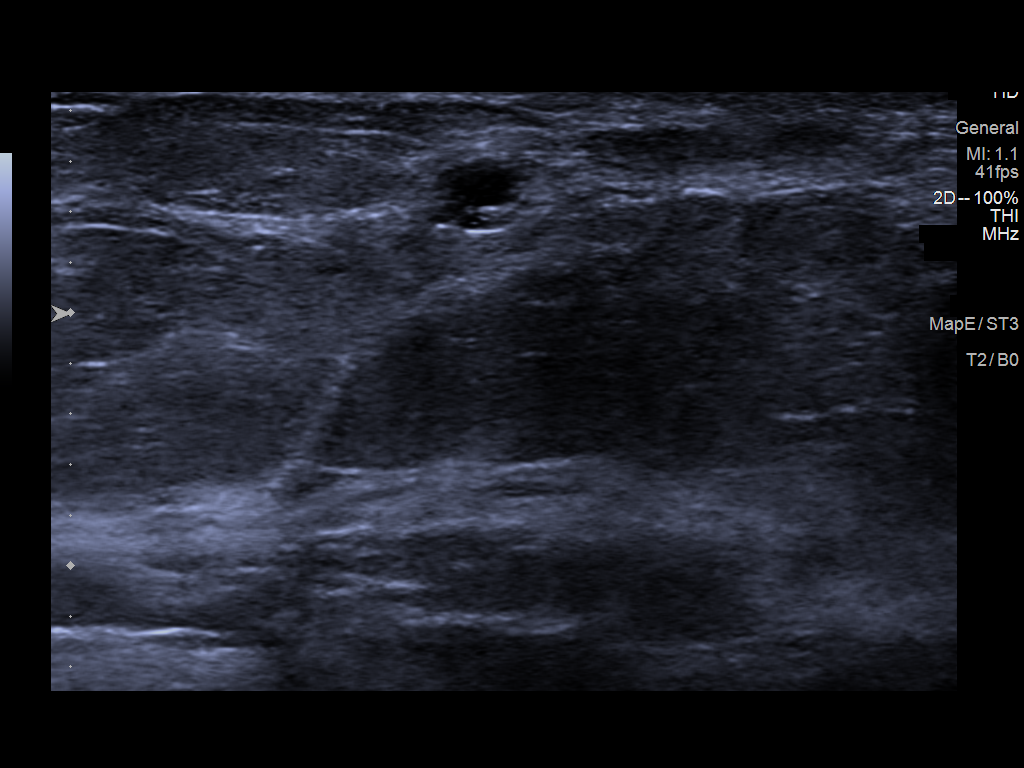
[im 5/5]
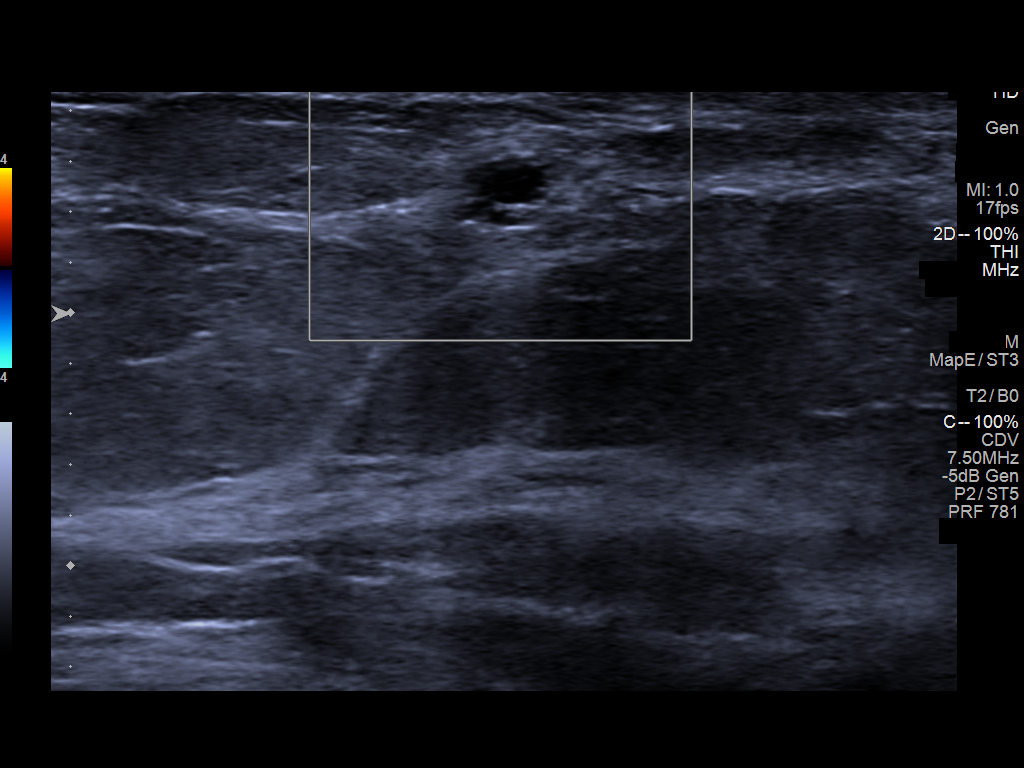

[5 of 5 positions shown; findings below may reference images not displayed]

ACR Breast Density Category b: There are scattered areas of
fibroglandular density.
FINDINGS: The possible mass noted in the anterior left breast on the screening
study persists on the diagnostic images. Mass lies near the 12
o'clock retroareolar region. It is approximately 5 mm in size with
mildly lobulated but circumscribed margins.

Mammographic images were processed with CAD.

Targeted ultrasound is performed, showing a small cyst in the left
breast at 10 o'clock, 2 cm the nipple, anterior depth, measuring 4 x
3 x 4 mm, consistent in size, shape and location to the mammographic
mass. There are no solid masses or suspicious lesions.
IMPRESSION: 1. No evidence of breast malignancy.
2. Small benign left breast cyst.

RECOMMENDATION:
Screening mammogram in one year.(Code:Q1-K-RC6)

I have discussed the findings and recommendations with the patient.
If applicable, a reminder letter will be sent to the patient
regarding the next appointment.

BI-RADS CATEGORY  2: Benign.

## 2020-09-18 ENCOUNTER — Encounter: Payer: Self-pay | Admitting: Gastroenterology

## 2020-09-18 ENCOUNTER — Encounter
Admission: RE | Disposition: A | Discharge status: Home / Self Care | Payer: Self-pay | Source: Home / Self Care | Attending: Gastroenterology

## 2020-09-18 ENCOUNTER — Other Ambulatory Visit: Payer: Self-pay

## 2020-09-18 ENCOUNTER — Ambulatory Visit: Payer: Medicare HMO | Admitting: Anesthesiology

## 2020-09-18 ENCOUNTER — Ambulatory Visit
Admission: RE | Admit: 2020-09-18 | Discharge: 2020-09-18 | Disposition: A | Discharge status: Home / Self Care | Payer: Medicare HMO | Attending: Gastroenterology | Admitting: Gastroenterology

## 2020-09-18 DIAGNOSIS — K644 Residual hemorrhoidal skin tags: Secondary | ICD-10-CM | POA: Diagnosis not present

## 2020-09-18 DIAGNOSIS — K635 Polyp of colon: Secondary | ICD-10-CM | POA: Diagnosis not present

## 2020-09-18 DIAGNOSIS — Z79899 Other long term (current) drug therapy: Secondary | ICD-10-CM | POA: Insufficient documentation

## 2020-09-18 DIAGNOSIS — Z818 Family history of other mental and behavioral disorders: Secondary | ICD-10-CM | POA: Diagnosis not present

## 2020-09-18 DIAGNOSIS — D123 Benign neoplasm of transverse colon: Secondary | ICD-10-CM | POA: Diagnosis not present

## 2020-09-18 DIAGNOSIS — Z803 Family history of malignant neoplasm of breast: Secondary | ICD-10-CM | POA: Insufficient documentation

## 2020-09-18 DIAGNOSIS — Z1211 Encounter for screening for malignant neoplasm of colon: Secondary | ICD-10-CM

## 2020-09-18 DIAGNOSIS — Z885 Allergy status to narcotic agent status: Secondary | ICD-10-CM | POA: Insufficient documentation

## 2020-09-18 DIAGNOSIS — Z801 Family history of malignant neoplasm of trachea, bronchus and lung: Secondary | ICD-10-CM | POA: Insufficient documentation

## 2020-09-18 DIAGNOSIS — D122 Benign neoplasm of ascending colon: Secondary | ICD-10-CM | POA: Insufficient documentation

## 2020-09-18 DIAGNOSIS — K5730 Diverticulosis of large intestine without perforation or abscess without bleeding: Secondary | ICD-10-CM | POA: Insufficient documentation

## 2020-09-18 DIAGNOSIS — D120 Benign neoplasm of cecum: Secondary | ICD-10-CM | POA: Insufficient documentation

## 2020-09-18 DIAGNOSIS — Z8051 Family history of malignant neoplasm of kidney: Secondary | ICD-10-CM | POA: Insufficient documentation

## 2020-09-18 DIAGNOSIS — K621 Rectal polyp: Secondary | ICD-10-CM | POA: Insufficient documentation

## 2020-09-18 HISTORY — PX: COLONOSCOPY WITH PROPOFOL: SHX5780

## 2020-09-18 SURGERY — COLONOSCOPY WITH PROPOFOL
Anesthesia: General

## 2020-09-18 MED ORDER — SODIUM CHLORIDE 0.9 % IV SOLN: INTRAVENOUS | Status: DC

## 2020-09-18 MED ORDER — PROPOFOL 500 MG/50ML IV EMUL
INTRAVENOUS | Status: DC | PRN
  Administered 2020-09-18: 120 ug/kg/min via INTRAVENOUS

## 2020-09-18 MED ORDER — PROPOFOL 10 MG/ML IV BOLUS
INTRAVENOUS | Status: DC | PRN
  Administered 2020-09-18: 30 mg via INTRAVENOUS
  Administered 2020-09-18: 70 mg via INTRAVENOUS

## 2020-09-18 MED ORDER — LIDOCAINE 2% (20 MG/ML) 5 ML SYRINGE
INTRAMUSCULAR | Status: DC | PRN
  Administered 2020-09-18: 25 mg via INTRAVENOUS

## 2020-09-18 NOTE — Anesthesia Postprocedure Evaluation (Signed)
Anesthesia Post Note  Patient: Julie Pena  Procedure(s) Performed: COLONOSCOPY WITH PROPOFOL  Patient location during evaluation: Phase II Anesthesia Type: General Level of consciousness: awake and alert, awake and oriented Pain management: pain level controlled Vital Signs Assessment: post-procedure vital signs reviewed and stable Respiratory status: spontaneous breathing, nonlabored ventilation and respiratory function stable Cardiovascular status: blood pressure returned to baseline and stable Postop Assessment: no apparent nausea or vomiting Anesthetic complications: no   No notable events documented.   Last Vitals:  Vitals:   09/18/20 0950 09/18/20 1000  BP: 113/74 115/74  Pulse: 67 (!) 57  Resp: 20 18  Temp:    SpO2: 99% 100%    Last Pain:  Vitals:   09/18/20 0930  TempSrc: Temporal  PainSc:                  Phill Mutter

## 2020-09-18 NOTE — H&P (Signed)
Cephas Darby, MD [address]  [place]  [place], [place] [address]  Main: [phone]  Fax: [phone] Pager: [phone]  Primary Care Physician:  Olin Hauser, DO Primary Gastroenterologist:  Dr. Cephas Darby  Pre-Procedure History & Physical: HPI:  Julie Pena is a 70 y.o. female is here for an colonoscopy.   Past Medical History:  Diagnosis Date   Diverticulosis     Past Surgical History:  Procedure Laterality Date   ABDOMINAL HYSTERECTOMY  1988   Initial partial 1978, then repeat TOTAL 1988   BREAST EXCISIONAL BIOPSY Right mid 1980s   benign   CATARACT EXTRACTION Bilateral 2018   DENTAL SURGERY  04/2019   OOPHORECTOMY     TONSILLECTOMY Bilateral 1963    Prior to Admission medications   Medication Sig Start Date End Date Taking? Authorizing Provider  Probiotic Product (ALIGN PO) Take by mouth. Take probiotic daily   Yes [provider]  Polyethylene Glycol 3350 (MIRALAX PO) Take by mouth.    [provider]  rosuvastatin (CRESTOR) 5 MG tablet Take 1 tablet (5 mg total) by mouth at bedtime. Patient not taking: Reported on 08/26/2020 07/10/20   Olin Hauser, DO    Allergies as of 08/26/2020 - Review Complete 08/26/2020  Allergen Reaction Noted   Codeine Hives 08/11/2017    Family History  Problem Relation Age of Onset   Cancer Mother        kidney   Kidney cancer Mother    Cancer Father        lung   Alcohol abuse Father    Depression Father    Lung cancer Father    Colon cancer Neg Hx    Breast cancer Neg Hx     Social History   Socioeconomic History   Marital status: Married    Spouse name: Chanique Duca   Number of children: Not on file   Years of education: Not on file   Highest education level: Not on file  Occupational History   Occupation: retired  Tobacco Use   Smoking status: Every Day    Packs/day: 0.50    Years: 47.00    Pack years: 23.50    Types: Cigarettes   Smokeless  tobacco: Current   Tobacco comments:    enrolled in smoking cessation class starting 08/2019  Vaping Use   Vaping Use: Never used  Substance and Sexual Activity   Alcohol use: Never   Drug use: Never   Sexual activity: Not on file  Other Topics Concern   Not on file  Social History Narrative   Not on file   Social Determinants of Health   Financial Resource Strain: Not on file  Food Insecurity: Not on file  Transportation Needs: Not on file  Physical Activity: Not on file  Stress: Not on file  Social Connections: Not on file  Intimate Partner Violence: Not on file    Review of Systems: See HPI, otherwise negative ROS  Physical Exam: BP 137/73   Pulse 90   Temp 97.7 F (36.5 C) (Temporal)   Resp 16   Ht 5\' 6"  (1.676 m)   Wt 75.8 kg   SpO2 99%   BMI 26.95 kg/m  General:   Alert,  pleasant and cooperative in NAD Head:  Normocephalic and atraumatic. Neck:  Supple; no masses or thyromegaly. Lungs:  Clear throughout to auscultation.    Heart:  Regular rate and rhythm. Abdomen:  Soft, nontender and nondistended. Normal bowel  sounds, without guarding, and without rebound.   Neurologic:  Alert and  oriented x4;  grossly normal neurologically.  Impression/Plan: Julie Pena is here for an colonoscopy to be performed for colon cancer screening  Risks, benefits, limitations, and alternatives regarding  colonoscopy have been reviewed with the patient.  Questions have been answered.  All parties agreeable.   Julie Sear, MD  09/18/2020, 8:21 AM

## 2020-09-18 NOTE — Transfer of Care (Signed)
Immediate Anesthesia Transfer of Care Note  Patient: Julie Pena  Procedure(s) Performed: COLONOSCOPY WITH PROPOFOL  Patient Location: Endoscopy Unit  Anesthesia Type:General  Level of Consciousness: awake  Airway & Oxygen Therapy: Patient Spontanous Breathing  Post-op Assessment: Report given to RN and Post -op Vital signs reviewed and stable  Post vital signs: Reviewed  Last Vitals:  Vitals Value Taken Time  BP 97/52 09/18/20 0929  Temp    Pulse 73 09/18/20 0930  Resp 20 09/18/20 0930  SpO2 96 % 09/18/20 0930  Vitals shown include unvalidated device data.  Last Pain:  Vitals:   09/18/20 0744  TempSrc: Temporal  PainSc: 0-No pain         Complications: No notable events documented.

## 2020-09-18 NOTE — Anesthesia Preprocedure Evaluation (Signed)
Anesthesia Evaluation  Patient identified by MRN, date of birth, ID band Patient awake    Reviewed: Allergy & Precautions, NPO status , Patient's Chart, lab work & pertinent test results  History of Anesthesia Complications Negative for: history of anesthetic complications  Airway Mallampati: II  TM Distance: >3 FB Neck ROM: Full    Dental  (+) Implants   Pulmonary neg sleep apnea, neg COPD, Current Smoker and Patient abstained from smoking.,    breath sounds clear to auscultation- rhonchi (-) wheezing      Cardiovascular Exercise Tolerance: Good (-) hypertension(-) CAD and (-) Past MI  Rhythm:Regular Rate:Normal - Systolic murmurs and - Diastolic murmurs    Neuro/Psych negative neurological ROS  negative psych ROS   GI/Hepatic negative GI ROS, Neg liver ROS,   Endo/Other  negative endocrine ROSneg diabetes  Renal/GU negative Renal ROS     Musculoskeletal negative musculoskeletal ROS (+)   Abdominal (+) - obese,   Peds  Hematology negative hematology ROS (+)   Anesthesia Other Findings Past Medical History: No date: Diverticulosis   Reproductive/Obstetrics                             Anesthesia Physical Anesthesia Plan  ASA: 2  Anesthesia Plan: General   Post-op Pain Management:    Induction: Intravenous  PONV Risk Score and Plan: 1 and Propofol infusion  Airway Management Planned: Natural Airway  Additional Equipment:   Intra-op Plan:   Post-operative Plan:   Informed Consent: I have reviewed the patients History and Physical, chart, labs and discussed the procedure including the risks, benefits and alternatives for the proposed anesthesia with the patient or authorized representative who has indicated his/her understanding and acceptance.     Dental advisory given  Plan Discussed with: CRNA and Anesthesiologist  Anesthesia Plan Comments:         Anesthesia  Quick Evaluation

## 2020-09-18 NOTE — Op Note (Signed)
[facility] Gastroenterology Patient Name: Julie Pena Procedure Date: 09/18/2020 8:37 AM MRN: [id number] Account #: [id number] Date of Birth: [birth year] Admit Type: Outpatient Age: 70 Room: [facility] ENDO ROOM 3 Gender: Female Note Status: Finalized Procedure:             Colonoscopy Indications:           Screening for colorectal malignant neoplasm, Last                         colonoscopy: August 2012 Providers:             Lin Landsman MD, MD Referring MD:          Olin Hauser (Referring MD) Medicines:             General Anesthesia Complications:         No immediate complications. Estimated blood loss: None. Procedure:             Pre-Anesthesia Assessment:                        - Prior to the procedure, a History and Physical was                         performed, and patient medications and allergies were                         reviewed. The patient is competent. The risks and                         benefits of the procedure and the sedation options and                         risks were discussed with the patient. All questions                         were answered and informed consent was obtained.                         Patient identification and proposed procedure were                         verified by the physician, the nurse, the                         anesthesiologist, the anesthetist and the technician                         in the pre-procedure area in the procedure room in the                         endoscopy suite. Mental Status Examination: alert and                         oriented. Airway Examination: normal oropharyngeal                         airway and neck mobility. Respiratory Examination:  clear to auscultation. CV Examination: normal.                         Prophylactic Antibiotics: The patient does not require                         prophylactic antibiotics. Prior Anticoagulants: The                          patient has taken no previous anticoagulant or                         antiplatelet agents. ASA Grade Assessment: II - A                         patient with mild systemic disease. After reviewing                         the risks and benefits, the patient was deemed in                         satisfactory condition to undergo the procedure. The                         anesthesia plan was to use general anesthesia.                         Immediately prior to administration of medications,                         the patient was re-assessed for adequacy to receive                         sedatives. The heart rate, respiratory rate, oxygen                         saturations, blood pressure, adequacy of pulmonary                         ventilation, and response to care were monitored                         throughout the procedure. The physical status of the                         patient was re-assessed after the procedure.                        After obtaining informed consent, the colonoscope was                         passed under direct vision. Throughout the procedure,                         the patient's blood pressure, pulse, and oxygen                         saturations were monitored continuously. The  Colonoscope was introduced through the anus and                         advanced to the the terminal ileum, with                         identification of the appendiceal orifice and IC                         valve. The colonoscopy was performed without                         difficulty. The patient tolerated the procedure well.                         The quality of the bowel preparation was evaluated                         using the BBPS [facility] Bowel Preparation Scale) with                         scores of: Right Colon = 3, Transverse Colon = 3 and                         Left Colon = 3 (entire mucosa seen well with no                          residual staining, small fragments of stool or opaque                         liquid). The total BBPS score equals 9. Findings:      The perianal and digital rectal examinations were normal. Pertinent       negatives include normal sphincter tone and no palpable rectal lesions.      Five sessile polyps were found in the rectum, transverse colon,       ascending colon and cecum. The polyps were 3 to 5 mm in size. These       polyps were removed with a cold snare. Resection and retrieval were       complete. Estimated blood loss: none.      Multiple diverticula were found in the recto-sigmoid colon, sigmoid       colon and descending colon.      Non-bleeding external hemorrhoids were found during retroflexion. The       hemorrhoids were medium-sized. Impression:            - Five 3 to 5 mm polyps in the rectum, in the                         transverse colon, in the ascending colon and in the                         cecum, removed with a cold snare. Resected and                         retrieved.                        -  Diverticulosis in the recto-sigmoid colon, in the                         sigmoid colon and in the descending colon.                        - Non-bleeding external hemorrhoids. Recommendation:        - Discharge patient to home (with escort).                        - Resume previous diet today.                        - Continue present medications.                        - Await pathology results.                        - Repeat colonoscopy in 3 - 5 years for surveillance.                        - Return to my office as previously scheduled. Procedure Code(s):     --- Professional ---                        [phone], Colonoscopy, flexible; with removal of                         tumor(s), polyp(s), or other lesion(s) by snare                         technique Diagnosis Code(s):     --- Professional ---                        Z12.11, Encounter for screening  for malignant neoplasm                         of colon                        K62.1, Rectal polyp                        K63.5, Polyp of colon                        K64.4, Residual hemorrhoidal skin tags                        K57.30, Diverticulosis of large intestine without                         perforation or abscess without bleeding CPT copyright 2019 American Medical Association. All rights reserved. The codes documented in this report are preliminary and upon coder review may  be revised to meet current compliance requirements. Dr. Ulyess Mort Lin Landsman MD, MD 09/18/2020 9:27:36 AM This report has been signed electronically. Number of Addenda: 0 Note Initiated On: 09/18/2020 8:37 AM Scope Withdrawal Time: 0 hours 26 minutes 37 seconds  Total Procedure Duration: 0 hours 31 minutes 36  seconds  Estimated Blood Loss:  Estimated blood loss: none.      [facility]

## 2020-09-19 ENCOUNTER — Encounter: Payer: Self-pay | Admitting: Gastroenterology

## 2020-09-19 LAB — SURGICAL PATHOLOGY

## 2020-09-20 ENCOUNTER — Encounter: Payer: Self-pay | Admitting: Gastroenterology

## 2020-10-02 ENCOUNTER — Other Ambulatory Visit: Payer: Self-pay

## 2020-10-09 ENCOUNTER — Ambulatory Visit: Payer: Self-pay | Admitting: Family Medicine

## 2020-11-22 ENCOUNTER — Other Ambulatory Visit: Payer: Medicare HMO

## 2021-01-06 ENCOUNTER — Ambulatory Visit (INDEPENDENT_AMBULATORY_CARE_PROVIDER_SITE_OTHER): Payer: Medicare HMO | Admitting: Family Medicine

## 2021-01-06 ENCOUNTER — Encounter: Payer: Self-pay | Admitting: Family Medicine

## 2021-01-06 ENCOUNTER — Other Ambulatory Visit: Payer: Self-pay

## 2021-01-06 VITALS — Ht 66.0 in | Wt 167.0 lb

## 2021-01-06 DIAGNOSIS — B349 Viral infection, unspecified: Secondary | ICD-10-CM

## 2021-01-06 MED ORDER — BENZONATATE 100 MG PO CAPS: 100.0000 mg | ORAL_CAPSULE | Freq: Three times a day (TID) | ORAL | 0 refills | Status: DC | PRN

## 2021-01-06 MED ORDER — PREDNISONE 10 MG PO TABS: ORAL_TABLET | ORAL | 0 refills | Status: DC

## 2021-01-06 NOTE — Progress Notes (Signed)
Virtual Visit via Telephone The purpose of this virtual visit is to provide medical care while limiting exposure to the novel coronavirus (COVID19) for both patient and office staff.  Consent was obtained for phone visit:  Yes.   Answered questions that patient had about telehealth interaction:  Yes.   I discussed the limitations, risks, security and privacy concerns of performing an evaluation and management service by telephone. I also discussed with the patient that there may be a patient responsible charge related to this service. The patient expressed understanding and agreed to proceed.  Patient Location: Home Provider Location: [facility] (Office)  Participants in virtual visit: - Patient: Julie Pena - CMA: Orinda Kenner, Annetta North - Provider: Dr Parks Ranger  ---------------------------------------------------------------------- Chief Complaint  Patient presents with   Cough   Nasal Congestion    S: Reviewed CMA documentation. I have called patient and gathered additional HPI as follows:  Cough / Congestion / Fever Reports that symptoms started Friday 01/03/21. She was outdoors late evening day before on Thursday. She developed fever on Friday for 2 days. Initially had cough initially productive and body aches and sinus deeper pressure headache, initially sore throat that has improved. - Now fever has resolved. Her cough has resolved, only has tickle in throat mild irritative cough. - Improved, able to get out of bed and do a bit more now but still feels tired and sluggish still - Admits some loose stool diarrhea - Taking OTC Advil cold / sinus - She did a home COVID test on Saturday negative  Denies any fevers, chills, sweats, body ache, cough, shortness of breath, sinus pain or pressure, headache, abdominal pain, diarrhea  Past Medical History:  Diagnosis Date   Diverticulosis    Social History   Tobacco Use   Smoking status: Every Day    Packs/day:  0.50    Years: 47.00    Pack years: 23.50    Types: Cigarettes   Smokeless tobacco: Current   Tobacco comments:    enrolled in smoking cessation class starting 08/2019  Vaping Use   Vaping Use: Never used  Substance Use Topics   Alcohol use: Never   Drug use: Never    Current Outpatient Medications:    benzonatate (TESSALON) 100 MG capsule, Take 1 capsule (100 mg total) by mouth 3 (three) times daily as needed for cough., Disp: 30 capsule, Rfl: 0   predniSONE (DELTASONE) 10 MG tablet, Take 6 tabs with breakfast Day 1, 5 tabs Day 2, 4 tabs Day 3, 3 tabs Day 4, 2 tabs Day 5, 1 tab Day 6., Disp: 21 tablet, Rfl: 0   Probiotic Product (ALIGN PO), Take by mouth. Take probiotic daily, Disp: , Rfl:    rosuvastatin (CRESTOR) 5 MG tablet, Take 1 tablet (5 mg total) by mouth at bedtime., Disp: 90 tablet, Rfl: 3  Depression screen [facility] 2/9 07/11/2019 11/10/2018 06/29/2018  Decreased Interest 0 0 0  Down, Depressed, Hopeless 0 0 0  PHQ - 2 Score 0 0 0    No flowsheet data found.  -------------------------------------------------------------------------- O: No physical exam performed due to remote telephone encounter.  Lab results reviewed.  No results found for this or any previous visit (from the past 2160 hour(s)).  -------------------------------------------------------------------------- A&P:  Problem List Items Addressed This Visit   None Visit Diagnoses     Acute viral syndrome    -  Primary   Relevant Medications   predniSONE (DELTASONE) 10 MG tablet   benzonatate (TESSALON) 100  MG capsule      Acute viral syndrome 4 day onset Cannot rule out COVID19 - she had negative test 2 days ago, may have been early, will repeat Possible Flu however may be early for flu case Now husband with same constellation of viral symptoms onset today  Clinically improving overall symptoms now.   Will presume COVID and recommend quarantine protocol Start Prednisone taper 6 day course Start  Tessalon Perls take 1 capsule up to 3 times a day as needed for cough Use OTC cough cold medication PRN Improve hydration Monitor symptoms for worsening or complications as advised  Meds ordered this encounter  Medications   predniSONE (DELTASONE) 10 MG tablet    Sig: Take 6 tabs with breakfast Day 1, 5 tabs Day 2, 4 tabs Day 3, 3 tabs Day 4, 2 tabs Day 5, 1 tab Day 6.    Dispense:  21 tablet    Refill:  0   benzonatate (TESSALON) 100 MG capsule    Sig: Take 1 capsule (100 mg total) by mouth 3 (three) times daily as needed for cough.    Dispense:  30 capsule    Refill:  0    Follow-up: - Return in 1-2 weeks if unresolved  Patient verbalizes understanding with the above medical recommendations including the limitation of remote medical advice.  Specific follow-up and call-back criteria were given for patient to follow-up or seek medical care more urgently if needed.   - Time spent in direct consultation with patient on phone: 8 minutes   Nobie Putnam, [facility] 01/06/2021, 1:26 PM

## 2021-01-06 NOTE — Patient Instructions (Signed)
° °  Please schedule a Follow-up Appointment to: No follow-ups on file. ° °If you have any other questions or concerns, please feel free to call the office or send a message through MyChart. You may also schedule an earlier appointment if necessary. ° °Additionally, you may be receiving a survey about your experience at our office within a few days to 1 week by e-mail or mail. We value your feedback. ° °Kiyani Jernigan, DO °[facility] °

## 2021-01-07 ENCOUNTER — Telehealth: Payer: Medicare HMO | Admitting: Family Medicine

## 2021-01-14 ENCOUNTER — Ambulatory Visit (INDEPENDENT_AMBULATORY_CARE_PROVIDER_SITE_OTHER): Payer: Medicare HMO

## 2021-01-14 VITALS — Ht 66.0 in | Wt 170.0 lb

## 2021-01-14 DIAGNOSIS — Z0000 Encounter for general adult medical examination without abnormal findings: Secondary | ICD-10-CM | POA: Diagnosis not present

## 2021-01-14 NOTE — Patient Instructions (Signed)
Julie Pena , Thank you for taking time to come for your Medicare Wellness Visit. I appreciate your ongoing commitment to your health goals. Please review the following plan we discussed and let me know if I can assist you in the future.   Screening recommendations/referrals: Colonoscopy: completed 09/18/2020, due 09/18/2025 Mammogram: completed 08/01/2020 Bone Density: completed 08/01/2020 Recommended yearly ophthalmology/optometry visit for glaucoma screening and checkup Recommended yearly dental visit for hygiene and checkup  Vaccinations: Influenza vaccine: due Pneumococcal vaccine: completed 07/31/2020 Tdap vaccine: completed 08/25/2017, due 08/26/2027 Shingles vaccine: discussed   Covid-19:decline  Advanced directives: Please bring a copy of your POA (Power of Attorney) and/or Living Will to your next appointment.   Conditions/risks identified: smoking  Next appointment: Follow up in one year for your annual wellness visit    Preventive Care 24 Years and Older, Female Preventive care refers to lifestyle choices and visits with your health care provider that can promote health and wellness. What does preventive care include? A yearly physical exam. This is also called an annual well check. Dental exams once or twice a year. Routine eye exams. Ask your health care provider how often you should have your eyes checked. Personal lifestyle choices, including: Daily care of your teeth and gums. Regular physical activity. Eating a healthy diet. Avoiding tobacco and drug use. Limiting alcohol use. Practicing safe sex. Taking low-dose aspirin every day. Taking vitamin and mineral supplements as recommended by your health care provider. What happens during an annual well check? The services and screenings done by your health care provider during your annual well check will depend on your age, overall health, lifestyle risk factors, and family history of disease. Counseling  Your health  care provider may ask you questions about your: Alcohol use. Tobacco use. Drug use. Emotional well-being. Home and relationship well-being. Sexual activity. Eating habits. History of falls. Memory and ability to understand (cognition). Work and work Statistician. Reproductive health. Screening  You may have the following tests or measurements: Height, weight, and BMI. Blood pressure. Lipid and cholesterol levels. These may be checked every 5 years, or more frequently if you are over 50 years old. Skin check. Lung cancer screening. You may have this screening every year starting at age 59 if you have a 30-pack-year history of smoking and currently smoke or have quit within the past 15 years. Fecal occult blood test (FOBT) of the stool. You may have this test every year starting at age 37. Flexible sigmoidoscopy or colonoscopy. You may have a sigmoidoscopy every 5 years or a colonoscopy every 10 years starting at age 60. Hepatitis C blood test. Hepatitis B blood test. Sexually transmitted disease (STD) testing. Diabetes screening. This is done by checking your blood sugar (glucose) after you have not eaten for a while (fasting). You may have this done every 1-3 years. Bone density scan. This is done to screen for osteoporosis. You may have this done starting at age 37. Mammogram. This may be done every 1-2 years. Talk to your health care provider about how often you should have regular mammograms. Talk with your health care provider about your test results, treatment options, and if necessary, the need for more tests. Vaccines  Your health care provider may recommend certain vaccines, such as: Influenza vaccine. This is recommended every year. Tetanus, diphtheria, and acellular pertussis (Tdap, Td) vaccine. You may need a Td booster every 10 years. Zoster vaccine. You may need this after age 34. Pneumococcal 13-valent conjugate (PCV13) vaccine. One dose is recommended  after age  61. Pneumococcal polysaccharide (PPSV23) vaccine. One dose is recommended after age 62. Talk to your health care provider about which screenings and vaccines you need and how often you need them. This information is not intended to replace advice given to you by your health care provider. Make sure you discuss any questions you have with your health care provider. Document Released: 04/19/2015 Document Revised: 12/11/2015 Document Reviewed: 01/22/2015 Elsevier Interactive Patient Education  2017 [place] Prevention in the Home Falls can cause injuries. They can happen to people of all ages. There are many things you can do to make your home safe and to help prevent falls. What can I do on the outside of my home? Regularly fix the edges of walkways and driveways and fix any cracks. Remove anything that might make you trip as you walk through a door, such as a raised step or threshold. Trim any bushes or trees on the path to your home. Use bright outdoor lighting. Clear any walking paths of anything that might make someone trip, such as rocks or tools. Regularly check to see if handrails are loose or broken. Make sure that both sides of any steps have handrails. Any raised decks and porches should have guardrails on the edges. Have any leaves, snow, or ice cleared regularly. Use sand or salt on walking paths during winter. Clean up any spills in your garage right away. This includes oil or grease spills. What can I do in the bathroom? Use night lights. Install grab bars by the toilet and in the tub and shower. Do not use towel bars as grab bars. Use non-skid mats or decals in the tub or shower. If you need to sit down in the shower, use a plastic, non-slip stool. Keep the floor dry. Clean up any water that spills on the floor as soon as it happens. Remove soap buildup in the tub or shower regularly. Attach bath mats securely with double-sided non-slip rug tape. Do not have throw  rugs and other things on the floor that can make you trip. What can I do in the bedroom? Use night lights. Make sure that you have a light by your bed that is easy to reach. Do not use any sheets or blankets that are too big for your bed. They should not hang down onto the floor. Have a firm chair that has side arms. You can use this for support while you get dressed. Do not have throw rugs and other things on the floor that can make you trip. What can I do in the kitchen? Clean up any spills right away. Avoid walking on wet floors. Keep items that you use a lot in easy-to-reach places. If you need to reach something above you, use a strong step stool that has a grab bar. Keep electrical cords out of the way. Do not use floor polish or wax that makes floors slippery. If you must use wax, use non-skid floor wax. Do not have throw rugs and other things on the floor that can make you trip. What can I do with my stairs? Do not leave any items on the stairs. Make sure that there are handrails on both sides of the stairs and use them. Fix handrails that are broken or loose. Make sure that handrails are as long as the stairways. Check any carpeting to make sure that it is firmly attached to the stairs. Fix any carpet that is loose or worn. Avoid having throw  rugs at the top or bottom of the stairs. If you do have throw rugs, attach them to the floor with carpet tape. Make sure that you have a light switch at the top of the stairs and the bottom of the stairs. If you do not have them, ask someone to add them for you. What else can I do to help prevent falls? Wear shoes that: Do not have high heels. Have rubber bottoms. Are comfortable and fit you well. Are closed at the toe. Do not wear sandals. If you use a stepladder: Make sure that it is fully opened. Do not climb a closed stepladder. Make sure that both sides of the stepladder are locked into place. Ask someone to hold it for you, if  possible. Clearly mark and make sure that you can see: Any grab bars or handrails. First and last steps. Where the edge of each step is. Use tools that help you move around (mobility aids) if they are needed. These include: Canes. Walkers. Scooters. Crutches. Turn on the lights when you go into a dark area. Replace any light bulbs as soon as they burn out. Set up your furniture so you have a clear path. Avoid moving your furniture around. If any of your floors are uneven, fix them. If there are any pets around you, be aware of where they are. Review your medicines with your doctor. Some medicines can make you feel dizzy. This can increase your chance of falling. Ask your doctor what other things that you can do to help prevent falls. This information is not intended to replace advice given to you by your health care provider. Make sure you discuss any questions you have with your health care provider. Document Released: 01/17/2009 Document Revised: 08/29/2015 Document Reviewed: 04/27/2014 Elsevier Interactive Patient Education  2017 [employer].

## 2021-01-14 NOTE — Progress Notes (Signed)
I connected with Julie Pena today by telephone and verified that I am speaking with the correct person using two identifiers. Location patient: home Location provider: work Persons participating in the virtual visit: Gurleen, Larrivee LPN.   I discussed the limitations, risks, security and privacy concerns of performing an evaluation and management service by telephone and the availability of in person appointments. I also discussed with the patient that there may be a patient responsible charge related to this service. The patient expressed understanding and verbally consented to this telephonic visit.    Interactive audio and video telecommunications were attempted between this provider and patient, however failed, due to patient having technical difficulties OR patient did not have access to video capability.  We continued and completed visit with audio only.     Vital signs may be patient reported or missing.  Subjective:   Julie Pena is a 70 y.o. female who presents for Medicare Annual (Subsequent) preventive examination.  Review of Systems     Cardiac Risk Factors include: advanced age (>26men, >20 women);dyslipidemia;smoking/ tobacco exposure;sedentary lifestyle     Objective:    Today's Vitals   01/14/21 1405  Weight: 170 lb (77.1 kg)  Height: 5\' 6"  (1.676 m)   Body mass index is 27.44 kg/m.  Advanced Directives 01/14/2021 09/18/2020 07/11/2019  Does Patient Have a Medical Advance Directive? Yes Yes Yes  Type of Paramedic of [place];Living will [place];Living will Living will;Healthcare Power of [place] in Chart? No - copy requested No - copy requested No - copy requested    Current Medications (verified) Outpatient Encounter Medications as of 01/14/2021  Medication Sig   Probiotic Product (ALIGN PO) Take by mouth. Take probiotic daily   benzonatate (TESSALON) 100 MG  capsule Take 1 capsule (100 mg total) by mouth 3 (three) times daily as needed for cough. (Patient not taking: Reported on 01/14/2021)   predniSONE (DELTASONE) 10 MG tablet Take 6 tabs with breakfast Day 1, 5 tabs Day 2, 4 tabs Day 3, 3 tabs Day 4, 2 tabs Day 5, 1 tab Day 6. (Patient not taking: Reported on 01/14/2021)   rosuvastatin (CRESTOR) 5 MG tablet Take 1 tablet (5 mg total) by mouth at bedtime. (Patient not taking: Reported on 01/14/2021)   No facility-administered encounter medications on file as of 01/14/2021.    Allergies (verified) Codeine   History: Past Medical History:  Diagnosis Date   Diverticulosis    Past Surgical History:  Procedure Laterality Date   ABDOMINAL HYSTERECTOMY  1988   Initial partial 1978, then repeat TOTAL 1988   BREAST EXCISIONAL BIOPSY Right mid 1980s   benign   CATARACT EXTRACTION Bilateral 2018   COLONOSCOPY WITH PROPOFOL N/A 09/18/2020   Procedure: COLONOSCOPY WITH PROPOFOL;  Surgeon: Lin Landsman, MD;  Location: [facility] ENDOSCOPY;  Service: Gastroenterology;  Laterality: N/A;   DENTAL SURGERY  04/2019   OOPHORECTOMY     TONSILLECTOMY Bilateral 1963   Family History  Problem Relation Age of Onset   Cancer Mother        kidney   Kidney cancer Mother    Cancer Father        lung   Alcohol abuse Father    Depression Father    Lung cancer Father    Colon cancer Neg Hx    Breast cancer Neg Hx    Social History   Socioeconomic History   Marital status: Married  Spouse name: Elleah Hemsley   Number of children: Not on file   Years of education: Not on file   Highest education level: Not on file  Occupational History   Occupation: retired  Tobacco Use   Smoking status: Every Day    Packs/day: 0.50    Years: 47.00    Pack years: 23.50    Types: Cigarettes   Smokeless tobacco: Current   Tobacco comments:    enrolled in smoking cessation class starting 08/2019  Vaping Use   Vaping Use: Never used  Substance and Sexual  Activity   Alcohol use: Never   Drug use: Never   Sexual activity: Yes  Other Topics Concern   Not on file  Social History Narrative   Not on file   Social Determinants of Health   Financial Resource Strain: Low Risk    Difficulty of Paying Living Expenses: Not hard at all  Food Insecurity: No Food Insecurity   Worried About Charity fundraiser in the Last Year: Never true   [place] in the Last Year: Never true  Transportation Needs: No Transportation Needs   Lack of Transportation (Medical): No   Lack of Transportation (Non-Medical): No  Physical Activity: Inactive   Days of Exercise per Week: 0 days   Minutes of Exercise per Session: 0 min  Stress: No Stress Concern Present   Feeling of Stress : Not at all  Social Connections: Not on file    Tobacco Counseling Ready to quit: Yes Counseling given: Not Answered Tobacco comments: enrolled in smoking cessation class starting 08/2019   Clinical Intake:  Pre-visit preparation completed: Yes  Pain : No/denies pain     Nutritional Status: BMI 25 -29 Overweight Nutritional Risks: None Diabetes: No  How often do you need to have someone help you when you read instructions, pamphlets, or other written materials from your doctor or pharmacy?: 1 - Never What is the last grade level you completed in school?: 12th grade  Diabetic? no  Interpreter Needed?: No  Information entered by :: NAllen LPN   Activities of Daily Living In your present state of health, do you have any difficulty performing the following activities: 01/14/2021  Hearing? N  Vision? N  Difficulty concentrating or making decisions? N  Walking or climbing stairs? N  Dressing or bathing? N  Doing errands, shopping? N  Preparing Food and eating ? N  Using the Toilet? N  In the past six months, have you accidently leaked urine? N  Do you have problems with loss of bowel control? N  Managing your Medications? N  Managing your Finances? N   Housekeeping or managing your Housekeeping? N  Some recent data might be hidden    Patient Care Team: Olin Hauser, DO as PCP - General (Family Medicine)  Indicate any recent Medical Services you may have received from other than [facility] providers in the past year (date may be approximate).     Assessment:   This is a routine wellness examination for Julie Pena.  Hearing/Vision screen Vision Screening - Comments:: Regular eye exams, Dr. Ellin Mayhew  Dietary issues and exercise activities discussed: Current Exercise Habits: The patient does not participate in regular exercise at present   Goals Addressed             This Visit's Progress    Patient Stated       01/14/2021, wants to weigh 150 pounds       Depression Screen [facility] 2/9  Scores 01/14/2021 07/11/2019 11/10/2018 06/29/2018 08/11/2017  PHQ - 2 Score 0 0 0 0 0    Fall Risk Fall Risk  01/14/2021 07/11/2019 11/10/2018 06/29/2018 02/25/2018  Falls in the past year? 0 0 0 0 0  Number falls in past yr: - 0 - - -  Injury with Fall? - 0 - - -  Risk for fall due to : No Fall Risks - - - -  Follow up Falls evaluation completed;Education provided;Falls prevention discussed - Falls evaluation completed Falls evaluation completed Falls evaluation completed    FALL RISK PREVENTION PERTAINING TO THE HOME:  Any stairs in or around the home? Yes  If so, are there any without handrails? No  Home free of loose throw rugs in walkways, pet beds, electrical cords, etc? Yes  Adequate lighting in your home to reduce risk of falls? Yes   ASSISTIVE DEVICES UTILIZED TO PREVENT FALLS:  Life alert? No  Use of a cane, walker or w/c? No  Grab bars in the bathroom? No  Shower chair or bench in shower? Yes  Elevated toilet seat or a handicapped toilet? Yes   TIMED UP AND GO:  Was the test performed? No .      Cognitive Function:     6CIT Screen 01/14/2021  What Year? 0 points  What month? 0 points  What time? 0 points  Count back  from 20 0 points  Months in reverse 0 points  Repeat phrase 0 points  Total Score 0    Immunizations Immunization History  Administered Date(s) Administered   Fluad Quad(high Dose 65+) 01/19/2019   Influenza, High Dose Seasonal PF 01/22/2017   Influenza-Unspecified 02/05/2018, 01/10/2020   Pneumococcal Polysaccharide-23 07/31/2020   Tdap 08/25/2017    TDAP status: Up to date  Flu Vaccine status: Due, Education has been provided regarding the importance of this vaccine. Advised may receive this vaccine at local pharmacy or Health Dept. Aware to provide a copy of the vaccination record if obtained from local pharmacy or Health Dept. Verbalized acceptance and understanding.  Pneumococcal vaccine status: Up to date  Covid-19 vaccine status: Declined, Education has been provided regarding the importance of this vaccine but patient still declined. Advised may receive this vaccine at local pharmacy or Health Dept.or vaccine clinic. Aware to provide a copy of the vaccination record if obtained from local pharmacy or Health Dept. Verbalized acceptance and understanding.  Qualifies for Shingles Vaccine? Yes   Zostavax completed No   Shingrix Completed?: No.    Education has been provided regarding the importance of this vaccine. Patient has been advised to call insurance company to determine out of pocket expense if they have not yet received this vaccine. Advised may also receive vaccine at local pharmacy or Health Dept. Verbalized acceptance and understanding.  Screening Tests Health Maintenance  Topic Date Due   COVID-19 Vaccine (1) Never done   Zoster Vaccines- Shingrix (1 of 2) Never done   INFLUENZA VACCINE  11/04/2020   Fecal DNA (Cologuard)  11/21/2021   MAMMOGRAM  08/02/2022   TETANUS/TDAP  08/26/2027   DEXA SCAN  Completed   Hepatitis C Screening  Completed   HPV VACCINES  Aged Out    Health Maintenance  Health Maintenance Due  Topic Date Due   COVID-19 Vaccine (1) Never  done   Zoster Vaccines- Shingrix (1 of 2) Never done   INFLUENZA VACCINE  11/04/2020    Colorectal cancer screening: Type of screening: Colonoscopy. Completed 09/18/2020. Repeat every 5 years  Mammogram status: Completed 08/01/2020. Repeat every year  Bone Density status: Completed 08/01/2020.   Lung Cancer Screening: (Low Dose CT Chest recommended if Age 61-80 years, 30 pack-year currently smoking OR have quit w/in 15years.) does not qualify.   Lung Cancer Screening Referral: no  Additional Screening:  Hepatitis C Screening: does qualify; Completed 08/18/2017  Vision Screening: Recommended annual ophthalmology exams for early detection of glaucoma and other disorders of the eye. Is the patient up to date with their annual eye exam?  Yes  Who is the provider or what is the name of the office in which the patient attends annual eye exams? Dr. Ellin Mayhew If pt is not established with a provider, would they like to be referred to a provider to establish care? No .   Dental Screening: Recommended annual dental exams for proper oral hygiene  Community Resource Referral / Chronic Care Management: CRR required this visit?  No   CCM required this visit?  No      Plan:     I have personally reviewed and noted the following in the patient's chart:   Medical and social history Use of alcohol, tobacco or illicit drugs  Current medications and supplements including opioid prescriptions.  Functional ability and status Nutritional status Physical activity Advanced directives List of other physicians Hospitalizations, surgeries, and ER visits in previous 12 months Vitals Screenings to include cognitive, depression, and falls Referrals and appointments  In addition, I have reviewed and discussed with patient certain preventive protocols, quality metrics, and best practice recommendations. A written personalized care plan for preventive services as well as general preventive health  recommendations were provided to patient.     Kellie Simmering, LPN   09/60/4540   Nurse Notes:

## 2021-01-22 ENCOUNTER — Encounter: Payer: Self-pay | Admitting: Family Medicine

## 2021-01-22 ENCOUNTER — Ambulatory Visit (INDEPENDENT_AMBULATORY_CARE_PROVIDER_SITE_OTHER): Payer: Medicare HMO | Admitting: Family Medicine

## 2021-01-22 ENCOUNTER — Other Ambulatory Visit: Payer: Self-pay

## 2021-01-22 VITALS — BP 133/52 | HR 91 | Ht 66.0 in | Wt 174.2 lb

## 2021-01-22 DIAGNOSIS — H9191 Unspecified hearing loss, right ear: Secondary | ICD-10-CM | POA: Diagnosis not present

## 2021-01-22 DIAGNOSIS — H6123 Impacted cerumen, bilateral: Secondary | ICD-10-CM | POA: Diagnosis not present

## 2021-01-22 NOTE — Progress Notes (Signed)
Subjective:    Patient ID: Julie Pena, female    DOB: 1951/01/22, 70 y.o.   MRN: [id number]  Julie Pena is a 70 y.o. female presenting on 01/22/2021 for Ear Fullness   HPI  Bilateral Ear Cerumen Impaction Worse hearing recently with impacted ear wax. Had history of COVID recently, question sinuses. Has some ear discomfort L side. No ear pain on R side. Denies sinus pain pressure congestion  Depression screen [name] 2/9 01/22/2021 01/14/2021 07/11/2019  Decreased Interest 0 0 0  Down, Depressed, Hopeless 0 0 0  PHQ - 2 Score 0 0 0  Altered sleeping 0 - -  Tired, decreased energy 3 - -  Change in appetite 0 - -  Feeling bad or failure about yourself  0 - -  Trouble concentrating 0 - -  Moving slowly or fidgety/restless 0 - -  Suicidal thoughts 0 - -  PHQ-9 Score 3 - -  Difficult doing work/chores Not difficult at all - -    Social History   Tobacco Use   Smoking status: Every Day    Packs/day: 0.50    Years: 47.00    Pack years: 23.50    Types: Cigarettes   Smokeless tobacco: Current   Tobacco comments:    enrolled in smoking cessation class starting 08/2019  Vaping Use   Vaping Use: Never used  Substance Use Topics   Alcohol use: Never   Drug use: Never    Review of Systems Per HPI unless specifically indicated above     Objective:    BP (!) 133/52   Pulse 91   Ht 5\' 6"  (1.676 m)   Wt 174 lb 3.2 oz (79 kg)   SpO2 100%   BMI 28.12 kg/m   Wt Readings from Last 3 Encounters:  01/22/21 174 lb 3.2 oz (79 kg)  01/14/21 170 lb (77.1 kg)  01/06/21 167 lb (75.8 kg)    Physical Exam Vitals and nursing note reviewed.  Constitutional:      General: She is not in acute distress.    Appearance: Normal appearance. She is well-developed. She is not diaphoretic.     Comments: Well-appearing, comfortable, cooperative  HENT:     Head: Normocephalic and atraumatic.     Right Ear: There is impacted cerumen.     Left Ear: There is impacted cerumen.  Eyes:      General:        Right eye: No discharge.        Left eye: No discharge.     Conjunctiva/sclera: Conjunctivae normal.  Cardiovascular:     Rate and Rhythm: Normal rate.  Pulmonary:     Effort: Pulmonary effort is normal.  Skin:    General: Skin is warm and dry.     Findings: No erythema or rash.  Neurological:     Mental Status: She is alert and oriented to person, place, and time.  Psychiatric:        Mood and Affect: Mood normal.        Behavior: Behavior normal.        Thought Content: Thought content normal.     Comments: Well groomed, good eye contact, normal speech and thoughts    ________________________________________________________ PROCEDURE NOTE Date: 01/22/21 Bilateral Ear Lavage / Cerumen Removal Discussed benefits and risks (including pain / discomforts, dizziness, minor abrasion of ear canal). Verbal consent given by patient. Medication:  carbamide peroxide ear drops, Ear Lavage Solution (warm water + hydrogen peroxide) Performed  by Dr Parks Ranger, Loni Muse CMA Several drops of carbamide peroxide placed in each ear, allowed to sit for few minutes. Ear lavage solution flushed into one ear at a time in attempt to dislodge and remove ear wax.  Results successful with removal of all cerumen impaction   Repeat Ear Exam: - Completely removed cerumen now, with clear ear canals and visible TMs clear and normal appearance.    Results for orders placed or performed during the hospital encounter of 09/18/20  Surgical pathology  Result Value Ref Range   SURGICAL PATHOLOGY      SURGICAL PATHOLOGY CASE: [id number] PATIENT: Julie Pena Surgical Pathology Report     Specimen Submitted: A. Colon polyp, transverse; cold snare B. Colon polyp x2, cecum; c snare(1) cbx(1) C. Colon polyp, ascending; cold snare D. Rectum polyp; cold snare  Clinical History: Z12.11 screening.  Polyps; diverticulosis     DIAGNOSIS: A.  COLON POLYP, TRANSVERSE; COLD  SNARE: - TUBULAR ADENOMA, MULTIPLE FRAGMENTS. - NEGATIVE FOR HIGH-GRADE DYSPLASIA AND MALIGNANCY.  B.  COLON POLYP X 2, CECUM; COLD SNARE (1) AND COLD BIOPSY (1): - ONE 2 MM FRAGMENT WITH FEATURES SUGGESTIVE OF SERRATED POLYP. - ILEAL-TYPE MUCOSA WITH PROMINENT LYMPHOID AGGREGATES. - NEGATIVE FOR DYSPLASIA AND MALIGNANCY.  C.  COLON POLYP, ASCENDING; COLD SNARE: - TUBULAR ADENOMA, MULTIPLE FRAGMENTS, NEGATIVE FOR HIGH-GRADE DYSPLASIA AND MALIGNANCY. - SERRATED POLYP, MULTIPLE FRAGMENTS, NEGATIVE FOR DYSPLASIA AND MALIGNANCY.  D.  RECTUM POLYP; COLD SNARE: - TUBULAR ADENOMA, 1 FRAGMENT, N EGATIVE FOR HIGH-GRADE DYSPLASIA AND MALIGNANCY. - HYPERPLASTIC POLYP, 1 FRAGMENT, NEGATIVE FOR DYSPLASIA AND MALIGNANCY.  Comment: Multiple fragments of serrated polyp are present in part C (ascending colon polyp), admixed with tubular adenoma fragments. A similar serrated fragment is noted in B (cecum polyps). The serrated fragments are similar, small and not well oriented, so they cannot be categorized as either sessile serrated polyp or hyperplastic polyp.  Parts C and D are labeled as single polyps; fragments from different polyps may have been misplaced.  GROSS DESCRIPTION: A. Labeled: Cold snare transverse colon polyp Received: Formalin Collection time: 8:57 AM on 09/18/2020 Placed into formalin time: 8:57 AM on 09/18/2020 Tissue fragment(s): Multiple Size: Aggregate, 0.9 x 0.7 x 0.1 cm Description: Tan soft tissue fragments Entirely submitted in 1 cassette.  B. Labeled: Cold snare/cbx cecal polyp x2 Received: Formalin Collection time: 9 AM on 09/18/2020  Placed into formalin time: 9 AM on 09/19/2020 Tissue fragment(s): Multiple Size: Aggregate, 1.4 x 0.3 x 0.1 cm Description: Tan soft tissue fragments Entirely submitted in 1 cassette.  C. Labeled: Cold snare ascending colon polyp Received: Formalin Collection time: 9:19 AM on 09/18/2020 Placed into formalin time: 9:19 AM on  09/18/2020 Tissue fragment(s): Multiple Size: Aggregate, 1.8 x 0.6 x 0.1 cm Description: Received are fragments of tan-pink soft tissue admixed with a scant amount of intestinal debris.  The ratio of soft tissue to intestinal debris is 95: 5. Entirely submitted in 1 cassette.  D. Labeled: Cold snare rectum polyp Received: Formalin Collection time: 9:24 AM on 09/18/2020 Placed into formalin time: 9:24 AM on 09/18/2020 Tissue fragment(s): Multiple Size: Aggregate, 2.4 x 1 x 0.2 cm Description: Received are fragments of tan-pink soft tissue admixed with intestinal debris.  The ratio of soft tissue to intestinal debris is 30: 70. Entirely submitted  in 1 cassette.  RB 09/18/2020  Final Diagnosis performed by Bryan Lemma, MD.   Electronically signed 09/19/2020 7:14:13PM The electronic signature indicates that the named Attending Pathologist has evaluated the specimen Technical  component performed at [facility], [address], [place], [place] [address] Lab: [phone] Dir: Rush Farmer, MD, MMM  Professional component performed at [facility], [facility], [place], [place], [place] [address] Lab: [phone] Dir: Dellia Nims. Rubinas, MD       Assessment & Plan:   Problem List Items Addressed This Visit   None Visit Diagnoses     Bilateral impacted cerumen    -  Primary   Hearing reduced, right           Significant amount of large thick impacted cerumen bilaterally suspected primary cause of current reduced bilateral hearing and ear pain.  Plan: 1. Successful office ear lavage cerumen removal today, re-evaluated with clear ear canals and normal TMs 2. Counseled on avoiding Q-tips and may use Kleenex as wick, use OTC Debrox as needed 3. Follow-up as needed   No orders of the defined types were placed in this encounter.     Follow up plan: Return if symptoms worsen or fail to improve.   Nobie Putnam, DO [facility] 01/22/2021, 2:03 PM

## 2021-01-22 NOTE — Patient Instructions (Signed)
° °  Please schedule a Follow-up Appointment to: No follow-ups on file. ° °If you have any other questions or concerns, please feel free to call the office or send a message through MyChart. You may also schedule an earlier appointment if necessary. ° °Additionally, you may be receiving a survey about your experience at our office within a few days to 1 week by e-mail or mail. We value your feedback. ° °Lazaro Isenhower, DO °[facility], [facility] °

## 2021-03-13 ENCOUNTER — Ambulatory Visit (INDEPENDENT_AMBULATORY_CARE_PROVIDER_SITE_OTHER): Payer: Medicare HMO | Admitting: Internal Medicine

## 2021-03-13 ENCOUNTER — Other Ambulatory Visit: Payer: Self-pay

## 2021-03-13 ENCOUNTER — Encounter: Payer: Self-pay | Admitting: Internal Medicine

## 2021-03-13 VITALS — BP 129/57 | HR 98 | Temp 97.9°F | Ht 66.0 in | Wt 172.0 lb

## 2021-03-13 DIAGNOSIS — R140 Abdominal distension (gaseous): Secondary | ICD-10-CM

## 2021-03-13 DIAGNOSIS — R102 Pelvic and perineal pain: Secondary | ICD-10-CM | POA: Diagnosis not present

## 2021-03-13 DIAGNOSIS — R3989 Other symptoms and signs involving the genitourinary system: Secondary | ICD-10-CM | POA: Diagnosis not present

## 2021-03-13 DIAGNOSIS — R1030 Lower abdominal pain, unspecified: Secondary | ICD-10-CM | POA: Diagnosis not present

## 2021-03-13 DIAGNOSIS — K6289 Other specified diseases of anus and rectum: Secondary | ICD-10-CM

## 2021-03-13 LAB — POCT URINALYSIS DIPSTICK
Bilirubin, UA: NEGATIVE
Glucose, UA: NEGATIVE
Ketones, UA: NEGATIVE
Nitrite, UA: NEGATIVE
Protein, UA: NEGATIVE
Spec Grav, UA: 1.015 (ref 1.010–1.025)
Urobilinogen, UA: NEGATIVE E.U./dL — AB
pH, UA: 6 (ref 5.0–8.0)

## 2021-03-13 MED ORDER — METRONIDAZOLE 500 MG PO TABS: 500.0000 mg | ORAL_TABLET | Freq: Three times a day (TID) | ORAL | 0 refills | Status: AC

## 2021-03-13 MED ORDER — CIPROFLOXACIN HCL 500 MG PO TABS: 500.0000 mg | ORAL_TABLET | Freq: Two times a day (BID) | ORAL | 0 refills | Status: AC

## 2021-03-13 MED ORDER — METRONIDAZOLE 500 MG PO TABS: 500.0000 mg | ORAL_TABLET | Freq: Three times a day (TID) | ORAL | 0 refills | Status: DC

## 2021-03-13 NOTE — Progress Notes (Signed)
HPI  Pt presents to the clinic today with c/o bladder pressure, abdominal pain, gas, bloating, vaginal and rectal pain. She reports this started about 1 week ago. She denies urgency, frequency, dysuria or blood in her urine. She denies nausea, vomiting, reflux, constipation, diarrhea or blood in her stool. She is not sure if she has a UTI or diverticulitis, although it feels more like diverticulitis. She denies fever, chills or body aches. She has not taken anything OTC for her symptoms.    Review of Systems  Past Medical History:  Diagnosis Date   Diverticulosis     Family History  Problem Relation Age of Onset   Cancer Mother        kidney   Kidney cancer Mother    Cancer Father        lung   Alcohol abuse Father    Depression Father    Lung cancer Father    Colon cancer Neg Hx    Breast cancer Neg Hx     Social History   Socioeconomic History   Marital status: Married    Spouse name: Jenika Chiem   Number of children: Not on file   Years of education: Not on file   Highest education level: Not on file  Occupational History   Occupation: retired  Tobacco Use   Smoking status: Every Day    Packs/day: 0.50    Years: 47.00    Pack years: 23.50    Types: Cigarettes   Smokeless tobacco: Current   Tobacco comments:    enrolled in smoking cessation class starting 08/2019  Vaping Use   Vaping Use: Never used  Substance and Sexual Activity   Alcohol use: Never   Drug use: Never   Sexual activity: Yes  Other Topics Concern   Not on file  Social History Narrative   Not on file   Social Determinants of Health   Financial Resource Strain: Low Risk    Difficulty of Paying Living Expenses: Not hard at all  Food Insecurity: No Food Insecurity   Worried About Charity fundraiser in the Last Year: Never true   [place] in the Last Year: Never true  Transportation Needs: No Transportation Needs   Lack of Transportation (Medical): No   Lack of Transportation  (Non-Medical): No  Physical Activity: Inactive   Days of Exercise per Week: 0 days   Minutes of Exercise per Session: 0 min  Stress: No Stress Concern Present   Feeling of Stress : Not at all  Social Connections: Not on file  Intimate Partner Violence: Not on file    Allergies  Allergen Reactions   Codeine Hives     Constitutional: Denies fever, malaise, fatigue, headache or abrupt weight changes.   GI: Pt reports abdominal pain, gas, bloating and rectal pain. Denies nausea, vomiting, diarrhea, constipation or blood in her stool.  GU: Pt reports bladder pressure and vaginal pain.  Denies urgency, frequency, dysuria, burning sensation, blood in urine, odor or discharge. Skin: Denies redness, rashes, lesions or ulcercations.   No other specific complaints in a complete review of systems (except as listed in HPI above).    Objective:   Physical Exam BP (!) 129/57   Pulse 98   Temp 97.9 F (36.6 C) (Temporal)   Ht 5\' 6"  (1.676 m)   Wt 172 lb (78 kg)   BMI 27.76 kg/m   Wt Readings from Last 3 Encounters:  01/22/21 174 lb 3.2 oz (79 kg)  01/14/21 170 lb (77.1 kg)  01/06/21 167 lb (75.8 kg)    General: Appears her stated age, overweight, appears uncomfortable but in NAD. Cardiovascular: Normal rate and rhythm. S1,S2 noted.   Pulmonary/Chest: Normal effort and positive vesicular breath sounds. No respiratory distress. No wheezes, rales or ronchi noted.  Abdomen: Soft. Hyperactive bowel sounds. Tender to palpation over the bladder area. No CVA tenderness.        Assessment & Plan:   Bladder Pressure,  Vaginal Pain:  Urinalysis: trace luekocytes, moderate blood Will send urine culture eRx sent if for Cipro 500 mg BID x 10 days OK to take AZO OTC Drink plenty of fluids  Abdominal Pain, Gas, Bloating, Rectal Pain:  Will treat for possible diverticulitis flare RX for Cipro 500 mg BID x 10 days RX for Flagyl 500 mg TID x 10 days Encouraged clear liquid diet, advance as  tolerated  RTC as needed or if symptoms persist. Webb Silversmith, NP This visit occurred during the SARS-CoV-2 public health emergency.  Safety protocols were in place, including screening questions prior to the visit, additional usage of staff PPE, and extensive cleaning of exam room while observing appropriate contact time as indicated for disinfecting solutions.

## 2021-03-14 LAB — URINE CULTURE
MICRO NUMBER:: 12733755
Result:: NO GROWTH
SPECIMEN QUALITY:: ADEQUATE

## 2021-03-17 ENCOUNTER — Encounter: Payer: Self-pay | Admitting: Internal Medicine

## 2021-03-17 NOTE — Patient Instructions (Signed)
Diverticulitis Diverticulitis is when small pouches in your colon (large intestine) get infected or swollen. This causes pain in the belly (abdomen) and watery poop (diarrhea). These pouches are called diverticula. The pouches form in people who have a condition called diverticulosis. What are the causes? This condition may be caused by poop (stool) that gets trapped in the pouches in your colon. The poop lets germs (bacteria) grow in the pouches. This causes the infection. What increases the risk? You are more likely to get this condition if you have small pouches in your colon. The risk is higher if: You are overweight or very overweight (obese). You do not exercise enough. You drink alcohol. You smoke or use products with tobacco in them. You eat a diet that has a lot of red meat such as beef, pork, or lamb. You eat a diet that does not have enough fiber in it. You are older than 70 years of age. What are the signs or symptoms? Pain in the belly. Pain is often on the left side, but it may be in other areas. Fever and feeling cold. Feeling like you may vomit. Vomiting. Having cramps. Feeling full. Changes to how often you poop. Blood in your poop. How is this treated? Most cases are treated at home by: Taking over-the-counter pain medicines. Following a clear liquid diet. Taking antibiotic medicines. Resting. Very bad cases may need to be treated at a hospital. This may include: Not eating or drinking. Taking prescription pain medicine. Getting antibiotic medicines through an IV tube. Getting fluid and food through an IV tube. Having surgery. When you are feeling better, your doctor may tell you to have a test to check your colon (colonoscopy). Follow these instructions at home: Medicines Take over-the-counter and prescription medicines only as told by your doctor. These include: Antibiotics. Pain medicines. Fiber pills. Probiotics. Stool softeners. If you were  prescribed an antibiotic medicine, take it as told by your doctor. Do not stop taking the antibiotic even if you start to feel better. Ask your doctor if the medicine prescribed to you requires you to avoid driving or using machinery. Eating and drinking  Follow a diet as told by your doctor. When you feel better, your doctor may tell you to change your diet. You may need to eat a lot of fiber. Fiber makes it easier to poop (have a bowel movement). Foods with fiber include: Berries. Beans. Lentils. Green vegetables. Avoid eating red meat. General instructions Do not use any products that contain nicotine or tobacco, such as cigarettes, e-cigarettes, and chewing tobacco. If you need help quitting, ask your doctor. Exercise 3 or more times a week. Try to get 30 minutes each time. Exercise enough to sweat and make your heart beat faster. Keep all follow-up visits as told by your doctor. This is important. Contact a doctor if: Your pain does not get better. You are not pooping like normal. Get help right away if: Your pain gets worse. Your symptoms do not get better. Your symptoms get worse very fast. You have a fever. You vomit more than one time. You have poop that is: Bloody. Black. Tarry. Summary This condition happens when small pouches in your colon get infected or swollen. Take medicines only as told by your doctor. Follow a diet as told by your doctor. Keep all follow-up visits as told by your doctor. This is important. This information is not intended to replace advice given to you by your health care provider. Make sure you  discuss any questions you have with your health care provider. Document Revised: 01/02/2019 Document Reviewed: 01/02/2019 Elsevier Patient Education  2022 [employer].

## 2021-07-18 ENCOUNTER — Other Ambulatory Visit: Payer: Self-pay

## 2021-07-18 DIAGNOSIS — Z0000 Encounter for general adult medical examination without abnormal findings: Secondary | ICD-10-CM

## 2021-07-21 ENCOUNTER — Other Ambulatory Visit: Payer: Medicare HMO

## 2021-07-21 DIAGNOSIS — Z0000 Encounter for general adult medical examination without abnormal findings: Secondary | ICD-10-CM

## 2021-07-21 DIAGNOSIS — R7309 Other abnormal glucose: Secondary | ICD-10-CM | POA: Diagnosis not present

## 2021-07-21 DIAGNOSIS — E785 Hyperlipidemia, unspecified: Secondary | ICD-10-CM | POA: Diagnosis not present

## 2021-07-22 LAB — COMPREHENSIVE METABOLIC PANEL
AG Ratio: 2 (calc) (ref 1.0–2.5)
ALT: 11 U/L (ref 6–29)
AST: 13 U/L (ref 10–35)
Albumin: 4 g/dL (ref 3.6–5.1)
Alkaline phosphatase (APISO): 75 U/L (ref 37–153)
BUN: 16 mg/dL (ref 7–25)
CO2: 27 mmol/L (ref 20–32)
Calcium: 8.9 mg/dL (ref 8.6–10.4)
Chloride: 109 mmol/L (ref 98–110)
Creat: 0.77 mg/dL (ref 0.60–1.00)
Globulin: 2 g/dL (calc) (ref 1.9–3.7)
Glucose, Bld: 90 mg/dL (ref 65–99)
Potassium: 4.1 mmol/L (ref 3.5–5.3)
Sodium: 142 mmol/L (ref 135–146)
Total Bilirubin: 0.6 mg/dL (ref 0.2–1.2)
Total Protein: 6 g/dL — ABNORMAL LOW (ref 6.1–8.1)

## 2021-07-22 LAB — LIPID PANEL
Cholesterol: 173 mg/dL (ref <200)
HDL: 45 mg/dL — ABNORMAL LOW (ref >=50)
LDL Cholesterol (Calc): 105 mg/dL (calc) — ABNORMAL HIGH
Non-HDL Cholesterol (Calc): 128 mg/dL (calc) (ref <130)
Total CHOL/HDL Ratio: 3.8 (calc) (ref <5.0)
Triglycerides: 132 mg/dL (ref <150)

## 2021-07-22 LAB — CBC WITH DIFFERENTIAL/PLATELET
Absolute Monocytes: 290 cells/uL (ref 200–950)
Basophils Absolute: 21 cells/uL (ref 0–200)
Basophils Relative: 0.5 %
Eosinophils Absolute: 59 cells/uL (ref 15–500)
Eosinophils Relative: 1.4 %
HCT: 37.7 % (ref 35.0–45.0)
Hemoglobin: 12.6 g/dL (ref 11.7–15.5)
Lymphs Abs: 1117 cells/uL (ref 850–3900)
MCH: 30.5 pg (ref 27.0–33.0)
MCHC: 33.4 g/dL (ref 32.0–36.0)
MCV: 91.3 fL (ref 80.0–100.0)
MPV: 10.3 fL (ref 7.5–12.5)
Monocytes Relative: 6.9 %
Neutro Abs: 2713 cells/uL (ref 1500–7800)
Neutrophils Relative %: 64.6 %
Platelets: 169 10*3/uL (ref 140–400)
RBC: 4.13 10*6/uL (ref 3.80–5.10)
RDW: 13.6 % (ref 11.0–15.0)
Total Lymphocyte: 26.6 %
WBC: 4.2 10*3/uL (ref 3.8–10.8)

## 2021-07-22 LAB — HEMOGLOBIN A1C
Hgb A1c MFr Bld: 5.3 % of total Hgb (ref <5.7)
Mean Plasma Glucose: 105 mg/dL
eAG (mmol/L): 5.8 mmol/L

## 2021-07-22 LAB — TSH: TSH: 1.75 mIU/L (ref 0.40–4.50)

## 2021-07-23 ENCOUNTER — Other Ambulatory Visit: Payer: Self-pay | Admitting: Family Medicine

## 2021-07-23 ENCOUNTER — Encounter: Payer: Self-pay | Admitting: Family Medicine

## 2021-07-23 ENCOUNTER — Ambulatory Visit (INDEPENDENT_AMBULATORY_CARE_PROVIDER_SITE_OTHER): Payer: Medicare HMO | Admitting: Family Medicine

## 2021-07-23 VITALS — BP 120/60 | HR 87 | Ht 66.0 in | Wt 174.2 lb

## 2021-07-23 DIAGNOSIS — Z0000 Encounter for general adult medical examination without abnormal findings: Secondary | ICD-10-CM | POA: Diagnosis not present

## 2021-07-23 DIAGNOSIS — E663 Overweight: Secondary | ICD-10-CM

## 2021-07-23 DIAGNOSIS — Z1231 Encounter for screening mammogram for malignant neoplasm of breast: Secondary | ICD-10-CM

## 2021-07-23 DIAGNOSIS — R7309 Other abnormal glucose: Secondary | ICD-10-CM

## 2021-07-23 DIAGNOSIS — E785 Hyperlipidemia, unspecified: Secondary | ICD-10-CM

## 2021-07-23 NOTE — Progress Notes (Signed)
 Subjective:    Patient ID: Julie Pena, female    DOB: 09-04-1950, 71 y.o.   MRN: [id number]  Julie Pena is a 71 y.o. female presenting on 07/23/2021 for Annual Exam   HPI  Here for Annual Physical and Lab Review.   History of endometriosis / Postmenopausal Estrogen Deficiency S/p partial hysterectomy and then 2nd complete Postmenopausal Estrogen Deficiency She has been on chronic HRT, Premarin, now down to HALF of 0.5 one pill every 3 days, trying to wean off in future.    History of recurrent UTI vs Asymptomatic Bacteruria Chronic problem.     LIFESTYLE / WELLNESS / Overweight BMI >28 Weight down 16 lbs - Diet is balanced now reduced portion sizes, and she avoids snacking and any carbs between meals, drinks a lot of water One tablespoon daily, apple cider vinegar with mother   HYPERLIPIDEMIA: - Reports no concerns. Last lipid panel 07/2021, mild elevated TG, LDL from 110 down to 105, previously had been started on trial Rosuvastatin 5mg  dose, but she never started was hesitant. Not on any medication Lifestyle - Diet: admits not following low cholesterol diet.     Additional history   History of recurrent Diverticulitis Chronic problem with diverticulitis She uses generic Miralax regularly   Umbilical Hernia Chronic problem, not causing any abdominal pain or complication. Reports increased bulging periumbilical superior aspect. She will notify when ready for consult from surgery.       Health Maintenance:   COVID declines.    Cervical CA Screening: S/p initial partial hysterectomy then total hysterectomy including removal of cervix. She had pap smear still completed by Dr Juanetta Gosling, reportedly normal. No prior abnormal screening, now age >58 without cervix   Breast CA Screening:  Due for mammogram screening. Last 3D mammogram 07/2020, negative, asymptomatic. Due for repeat. No abnormal and no fam history. Ordered today. She will call to schedule.   Colon CA  Screening:  Last Colonoscopy 09/18/2020 Dr Allegra Lai [facility] with some polyps tubular adenoma, repeat in 3 years 09/2023 Currently asymptomatic. No known family history of colon CA. Prior negative Cologuards    Due for initial pneumonia vaccine at age >76 - she deferred Prevnar-13 and will check with pharmacy when ready in May 2023       01/22/2021    1:38 PM 01/14/2021    2:12 PM 07/11/2019    3:25 PM  Depression screen PHQ 2/9  Decreased Interest 0 0 0  Down, Depressed, Hopeless 0 0 0  PHQ - 2 Score 0 0 0  Altered sleeping 0    Tired, decreased energy 3    Change in appetite 0    Feeling bad or failure about yourself  0    Trouble concentrating 0    Moving slowly or fidgety/restless 0    Suicidal thoughts 0    PHQ-9 Score 3    Difficult doing work/chores Not difficult at all      Past Medical History:  Diagnosis Date   Diverticulosis    Past Surgical History:  Procedure Laterality Date   ABDOMINAL HYSTERECTOMY  1988   Initial partial 1978, then repeat TOTAL 1988   BREAST EXCISIONAL BIOPSY Right mid 1980s   benign   CATARACT EXTRACTION Bilateral 2018   COLONOSCOPY WITH PROPOFOL N/A 09/18/2020   Procedure: COLONOSCOPY WITH PROPOFOL;  Surgeon: Toney Reil, MD;  Location: [facility] ENDOSCOPY;  Service: Gastroenterology;  Laterality: N/A;   DENTAL SURGERY  04/2019   OOPHORECTOMY     TONSILLECTOMY  Bilateral 1963   Social History   Socioeconomic History   Marital status: Married    Spouse name: Leniya Breit   Number of children: Not on file   Years of education: Not on file   Highest education level: Not on file  Occupational History   Occupation: retired  Tobacco Use   Smoking status: Every Day    Packs/day: 0.50    Years: 47.00    Pack years: 23.50    Types: Cigarettes   Smokeless tobacco: Current   Tobacco comments:    enrolled in smoking cessation class starting 08/2019  Vaping Use   Vaping Use: Never used  Substance and Sexual Activity   Alcohol use: Never    Drug use: Never   Sexual activity: Yes  Other Topics Concern   Not on file  Social History Narrative   Not on file   Social Determinants of Health   Financial Resource Strain: Low Risk    Difficulty of Paying Living Expenses: Not hard at all  Food Insecurity: No Food Insecurity   Worried About Programme researcher, broadcasting/film/video in the Last Year: Never true   Ran Out of Food in the Last Year: Never true  Transportation Needs: No Transportation Needs   Lack of Transportation (Medical): No   Lack of Transportation (Non-Medical): No  Physical Activity: Inactive   Days of Exercise per Week: 0 days   Minutes of Exercise per Session: 0 min  Stress: No Stress Concern Present   Feeling of Stress : Not at all  Social Connections: Not on file  Intimate Partner Violence: Not on file   Family History  Problem Relation Age of Onset   Cancer Mother        kidney   Kidney cancer Mother    Cancer Father        lung   Alcohol abuse Father    Depression Father    Lung cancer Father    Colon cancer Neg Hx    Breast cancer Neg Hx    Current Outpatient Medications on File Prior to Visit  Medication Sig   Probiotic Product (ALIGN PO) Take by mouth. Take probiotic daily   No current facility-administered medications on file prior to visit.    Review of Systems  Constitutional:  Negative for activity change, appetite change, chills, diaphoresis, fatigue and fever.  HENT:  Negative for congestion and hearing loss.   Eyes:  Negative for visual disturbance.  Respiratory:  Negative for cough, chest tightness, shortness of breath and wheezing.   Cardiovascular:  Negative for chest pain, palpitations and leg swelling.  Gastrointestinal:  Negative for abdominal pain, constipation, diarrhea, nausea and vomiting.  Genitourinary:  Negative for dysuria, frequency and hematuria.  Musculoskeletal:  Positive for arthralgias (R shoulder). Negative for neck pain.  Skin:  Negative for rash.  Neurological:  Negative  for dizziness, weakness, light-headedness, numbness and headaches.  Hematological:  Negative for adenopathy.  Psychiatric/Behavioral:  Negative for behavioral problems, dysphoric mood and sleep disturbance.   Per HPI unless specifically indicated above     Objective:    BP 120/60   Pulse 87   Ht 5\' 6"  (1.676 m)   Wt 174 lb 3.2 oz (79 kg)   SpO2 99%   BMI 28.12 kg/m   Wt Readings from Last 3 Encounters:  07/23/21 174 lb 3.2 oz (79 kg)  03/13/21 172 lb (78 kg)  01/22/21 174 lb 3.2 oz (79 kg)    Physical Exam Vitals and nursing  note reviewed.  Constitutional:      General: She is not in acute distress.    Appearance: She is well-developed. She is not diaphoretic.     Comments: Well-appearing, comfortable, cooperative  HENT:     Head: Normocephalic and atraumatic.  Eyes:     General:        Right eye: No discharge.        Left eye: No discharge.     Conjunctiva/sclera: Conjunctivae normal.     Pupils: Pupils are equal, round, and reactive to light.  Neck:     Thyroid: No thyromegaly.  Cardiovascular:     Rate and Rhythm: Normal rate and regular rhythm.     Pulses: Normal pulses.     Heart sounds: Normal heart sounds. No murmur heard. Pulmonary:     Effort: Pulmonary effort is normal. No respiratory distress.     Breath sounds: Normal breath sounds. No wheezing or rales.  Abdominal:     General: Bowel sounds are normal. There is no distension.     Palpations: Abdomen is soft. There is no mass.     Tenderness: There is no abdominal tenderness.  Musculoskeletal:        General: No tenderness. Normal range of motion.     Cervical back: Normal range of motion and neck supple.     Comments: Upper / Lower Extremities: - Normal muscle tone, strength bilateral upper extremities 5/5, lower extremities 5/5  Lymphadenopathy:     Cervical: No cervical adenopathy.  Skin:    General: Skin is warm and dry.     Findings: No erythema or rash.  Neurological:     Mental Status: She  is alert and oriented to person, place, and time.     Comments: Distal sensation intact to light touch all extremities  Psychiatric:        Mood and Affect: Mood normal.        Behavior: Behavior normal.        Thought Content: Thought content normal.     Comments: Well groomed, good eye contact, normal speech and thoughts   Results for orders placed or performed in visit on 07/21/21  HgB A1c  Result Value Ref Range   Hgb A1c MFr Bld 5.3 <5.7 % of total Hgb   Mean Plasma Glucose 105 mg/dL   eAG (mmol/L) 5.8 mmol/L  Lipid panel  Result Value Ref Range   Cholesterol 173 <200 mg/dL   HDL 45 (L) > OR = 50 mg/dL   Triglycerides 829 <562 mg/dL   LDL Cholesterol (Calc) 105 (H) mg/dL (calc)   Total CHOL/HDL Ratio 3.8 <5.0 (calc)   Non-HDL Cholesterol (Calc) 128 <130 mg/dL (calc)  Comprehensive Metabolic Panel (CMET)  Result Value Ref Range   Glucose, Bld 90 65 - 99 mg/dL   BUN 16 7 - 25 mg/dL   Creat 1.30 8.65 - 7.84 mg/dL   BUN/Creatinine Ratio NOT APPLICABLE 6 - 22 (calc)   Sodium 142 135 - 146 mmol/L   Potassium 4.1 3.5 - 5.3 mmol/L   Chloride 109 98 - 110 mmol/L   CO2 27 20 - 32 mmol/L   Calcium 8.9 8.6 - 10.4 mg/dL   Total Protein 6.0 (L) 6.1 - 8.1 g/dL   Albumin 4.0 3.6 - 5.1 g/dL   Globulin 2.0 1.9 - 3.7 g/dL (calc)   AG Ratio 2.0 1.0 - 2.5 (calc)   Total Bilirubin 0.6 0.2 - 1.2 mg/dL   Alkaline phosphatase (APISO) 75 37 -  153 U/L   AST 13 10 - 35 U/L   ALT 11 6 - 29 U/L  CBC with Differential  Result Value Ref Range   WBC 4.2 3.8 - 10.8 Thousand/uL   RBC 4.13 3.80 - 5.10 Million/uL   Hemoglobin 12.6 11.7 - 15.5 g/dL   HCT 29.5 62.1 - 30.8 %   MCV 91.3 80.0 - 100.0 fL   MCH 30.5 27.0 - 33.0 pg   MCHC 33.4 32.0 - 36.0 g/dL   RDW 65.7 84.6 - 96.2 %   Platelets 169 140 - 400 Thousand/uL   MPV 10.3 7.5 - 12.5 fL   Neutro Abs 2,713 1,500 - 7,800 cells/uL   Lymphs Abs 1,117 850 - 3,900 cells/uL   Absolute Monocytes 290 200 - 950 cells/uL   Eosinophils Absolute 59 15 -  500 cells/uL   Basophils Absolute 21 0 - 200 cells/uL   Neutrophils Relative % 64.6 %   Total Lymphocyte 26.6 %   Monocytes Relative 6.9 %   Eosinophils Relative 1.4 %   Basophils Relative 0.5 %  TSH  Result Value Ref Range   TSH 1.75 0.40 - 4.50 mIU/L      Assessment & Plan:   Problem List Items Addressed This Visit     Overweight (BMI 25.0-29.9)   Other Visit Diagnoses     Annual physical exam    -  Primary   Encounter for screening mammogram for malignant neoplasm of breast       Relevant Orders   MM 3D SCREEN BREAST BILATERAL       Updated Health Maintenance information Reviewed recent lab results with patient  Discussed LDL some natural lowering, can be related to diet intake. Did not pursue rx Statin Rosuvastatin dosage 5mg  in past, did not start therapy. We agree to pursue lifestyle management and she is okay and understanding with ASCVD risk 13% at this time.  Encouraged improvement to lifestyle with diet and exercise Goal of weight loss  History of some R Shoulder Bursitis. Episodic flare, reviewed today. Recommended OTC regimen and execises follow up if need.   Orders Placed This Encounter  Procedures   MM 3D SCREEN BREAST BILATERAL    Standing Status:   Future    Standing Expiration Date:   07/24/2022    Order Specific Question:   Reason for Exam (SYMPTOM  OR DIAGNOSIS REQUIRED)    Answer:   Screening bilateral 3D Mammogram [place]    Order Specific Question:   Preferred imaging location?    Answer:   [facility]     No orders of the defined types were placed in this encounter.    Follow up plan: Return in about 1 year (around 07/24/2022) for 1 year fasting lab only then 1 week later Annual Physical.  Future lab orders placed 07/2022  Saralyn Pilar, DO [facility] 07/23/2021, 10:54 AM

## 2021-07-23 NOTE — Patient Instructions (Addendum)
Thank you for coming to the office today. ? ?Last Colonoscopy 09/2020 - polyps, good for 3 years, next due 09/2023 ? ?ORDERED 3D mammo - For Mammogram screening for breast cancer  ? ?Call the [place] below anytime to schedule your own appointment now that order has been placed. ? ?[place] ?[facility] ?[address] ?[place], [place] [address] ?Phone: [phone] ? ?If interested in trial back on cholesterol medicine in future. Let me know. ? ?Future Umbilical Hernia Repair ? ?Consider newer versions of Laparoscopic Surgery or even Robotic Surgical Repair. ? ?START anti inflammatory topical - OTC Voltaren (generic Diclofenac) topical 2-4 times a day as needed for pain swelling of affected joint for 1-2 weeks or longer. ? ? ? ?DUE for FASTING BLOOD WORK (no food or drink after midnight before the lab appointment, only water or coffee without cream/sugar on the morning of) ? ?SCHEDULE "Lab Only" visit in the morning at the clinic for lab draw in 1 YEAR ? ?- Make sure Lab Only appointment is at about 1 week before your next appointment, so that results will be available ? ?For Lab Results, once available within 2-3 days of blood draw, you can can log in to MyChart online to view your results and a brief explanation. Also, we can discuss results at next follow-up visit. ? ? ? ?Please schedule a Follow-up Appointment to: Return in about 1 year (around 07/24/2022) for 1 year fasting lab only then 1 week later Annual Physical. ? ?If you have any other questions or concerns, please feel free to call the office or send a message through [place]. You may also schedule an earlier appointment if necessary. ? ?Additionally, you may be receiving a survey about your experience at our office within a few days to 1 week by e-mail or mail. We value your feedback. ? ?Nobie Putnam, DO ?[place] ? ?Range of Motion Shoulder Exercises ? ?Pendulum  Circles ?- Lean with your good arm against a counter or table for support ?- Bend forward with a wide stance (make sure your body is comfortable) ?- Your painful shoulder should hang down and feel "heavy" ?- Gently move your painful arm in small circles "clockwise" for several turns ?- Switch to "counterclockwise" for several turns ?- Early on keep circles narrow and move slowly ?- Later in rehab, move in larger circles and faster movement ? ? ?Wall Crawl ?- Stand close (about 1-2 ft away) to a wall, facing it directly ?- Reach out with your arm of painful shoulder and place fingers (not palm) on wall ?- You should make contact with wall at your waist level ?- Slowly walk your fingers up the wall. Stay in contact with wall entire time, do not remove fingers ?- Keep walking fingers up wall until you reach shoulder level ?- You may feel tightening or mild discomfort, once you reach a height that causes pain or if you are already above your shoulder height then stop. Repeat from starting position. ?- Early on stand closer to wall, move fingers slowly, and stay at or below shoulder level ?- Later in rehab, stand farther away from wall (fingertips), move fingers quicker, go above shoulder level ? ? ?

## 2021-08-27 ENCOUNTER — Ambulatory Visit
Admission: RE | Admit: 2021-08-27 | Discharge: 2021-08-27 | Disposition: A | Discharge status: Home / Self Care | Payer: Medicare HMO | Source: Ambulatory Visit | Attending: Family Medicine | Admitting: Family Medicine

## 2021-08-27 DIAGNOSIS — Z1231 Encounter for screening mammogram for malignant neoplasm of breast: Secondary | ICD-10-CM | POA: Insufficient documentation

## 2021-08-29 ENCOUNTER — Ambulatory Visit (INDEPENDENT_AMBULATORY_CARE_PROVIDER_SITE_OTHER): Payer: Medicare HMO | Admitting: Internal Medicine

## 2021-08-29 ENCOUNTER — Encounter: Payer: Self-pay | Admitting: Internal Medicine

## 2021-08-29 VITALS — BP 134/68 | HR 77 | Temp 96.9°F | Wt 174.0 lb

## 2021-08-29 DIAGNOSIS — H6121 Impacted cerumen, right ear: Secondary | ICD-10-CM

## 2021-08-29 NOTE — Progress Notes (Signed)
Subjective:    Patient ID: Julie Pena, female    DOB: March 22, 1951, 71 y.o.   MRN: [id number]  HPI  Pt presents to the clinic today with c/o wax buildup in her right ear.  She noticed this 4 days ago.  She does not express any pain but does have some decreased hearing.  She has not tried anything OTC for this.  Review of Systems  Past Medical History:  Diagnosis Date   Diverticulosis     Current Outpatient Medications  Medication Sig Dispense Refill   Probiotic Product (ALIGN PO) Take by mouth. Take probiotic daily     No current facility-administered medications for this visit.    Allergies  Allergen Reactions   Codeine Hives    Family History  Problem Relation Age of Onset   Cancer Mother        kidney   Kidney cancer Mother    Cancer Father        lung   Alcohol abuse Father    Depression Father    Lung cancer Father    Colon cancer Neg Hx    Breast cancer Neg Hx     Social History   Socioeconomic History   Marital status: Married    Spouse name: Vernadine Coombs   Number of children: Not on file   Years of education: Not on file   Highest education level: Not on file  Occupational History   Occupation: retired  Tobacco Use   Smoking status: Every Day    Packs/day: 0.50    Years: 47.00    Pack years: 23.50    Types: Cigarettes   Smokeless tobacco: Current   Tobacco comments:    enrolled in smoking cessation class starting 08/2019  Vaping Use   Vaping Use: Never used  Substance and Sexual Activity   Alcohol use: Never   Drug use: Never   Sexual activity: Yes  Other Topics Concern   Not on file  Social History Narrative   Not on file   Social Determinants of Health   Financial Resource Strain: Low Risk    Difficulty of Paying Living Expenses: Not hard at all  Food Insecurity: No Food Insecurity   Worried About Charity fundraiser in the Last Year: Never true   [place] in the Last Year: Never true  Transportation Needs: No  Transportation Needs   Lack of Transportation (Medical): No   Lack of Transportation (Non-Medical): No  Physical Activity: Inactive   Days of Exercise per Week: 0 days   Minutes of Exercise per Session: 0 min  Stress: No Stress Concern Present   Feeling of Stress : Not at all  Social Connections: Not on file  Intimate Partner Violence: Not on file     Constitutional: Denies fever, malaise, fatigue, headache or abrupt weight changes.  HEENT: Pt reports wax buildup in right ear. Denies eye pain, eye redness, ringing in the ears, runny nose, nasal congestion, bloody nose, or sore throat. Respiratory: Denies difficulty breathing, shortness of breath, cough or sputum production.   Cardiovascular: Denies chest pain, chest tightness, palpitations or swelling in the hands or feet.   No other specific complaints in a complete review of systems (except as listed in HPI above).     Objective:   Physical Exam  BP 134/68 (BP Location: Right Arm, Patient Position: Sitting, Cuff Size: Large)   Pulse 77   Temp (!) 96.9 F (36.1 C) (Temporal)   Wt  174 lb (78.9 kg)   SpO2 100%   BMI 28.08 kg/m   Wt Readings from Last 3 Encounters:  07/23/21 174 lb 3.2 oz (79 kg)  03/13/21 172 lb (78 kg)  01/22/21 174 lb 3.2 oz (79 kg)    General: Appears her stated age, overweight, in NAD. HEENT: Head: normal shape and size; Ears: Occlusive cerumen impaction, right ear;  Cardiovascular: Normal rate. Pulmonary/Chest: Normal effort. Neurological: Alert and oriented.   BMET    Component Value Date/Time   NA 142 07/21/2021 0846   K 4.1 07/21/2021 0846   CL 109 07/21/2021 0846   CO2 27 07/21/2021 0846   GLUCOSE 90 07/21/2021 0846   BUN 16 07/21/2021 0846   CREATININE 0.77 07/21/2021 0846   CALCIUM 8.9 07/21/2021 0846   GFRNONAA 78 07/03/2020 0823   GFRAA 90 07/03/2020 0823    Lipid Panel     Component Value Date/Time   CHOL 173 07/21/2021 0846   TRIG 132 07/21/2021 0846   HDL 45 (L)  07/21/2021 0846   CHOLHDL 3.8 07/21/2021 0846   LDLCALC 105 (H) 07/21/2021 0846    CBC    Component Value Date/Time   WBC 4.2 07/21/2021 0846   RBC 4.13 07/21/2021 0846   HGB 12.6 07/21/2021 0846   HCT 37.7 07/21/2021 0846   PLT 169 07/21/2021 0846   MCV 91.3 07/21/2021 0846   MCH 30.5 07/21/2021 0846   MCHC 33.4 07/21/2021 0846   RDW 13.6 07/21/2021 0846   LYMPHSABS 1,117 07/21/2021 0846   EOSABS 59 07/21/2021 0846   BASOSABS 21 07/21/2021 0846    Hgb A1C Lab Results  Component Value Date   HGBA1C 5.3 07/21/2021           Assessment & Plan:   Hearing Loss secondary to Cerumen Impaction, right Ear:  Manual lavage by CMA Advised her to use Debrox 2 times weekly to prevent wax buildup  RTC as needed  Webb Silversmith, NP

## 2021-08-29 NOTE — Patient Instructions (Signed)

## 2021-09-02 ENCOUNTER — Ambulatory Visit: Payer: Medicare HMO | Admitting: Family Medicine

## 2021-09-03 ENCOUNTER — Other Ambulatory Visit: Payer: Self-pay | Admitting: *Deleted

## 2021-09-03 DIAGNOSIS — Z122 Encounter for screening for malignant neoplasm of respiratory organs: Secondary | ICD-10-CM

## 2021-09-03 DIAGNOSIS — Z87891 Personal history of nicotine dependence: Secondary | ICD-10-CM

## 2021-09-03 DIAGNOSIS — F17210 Nicotine dependence, cigarettes, uncomplicated: Secondary | ICD-10-CM

## 2021-09-15 DIAGNOSIS — M25561 Pain in right knee: Secondary | ICD-10-CM | POA: Diagnosis not present

## 2021-09-19 DIAGNOSIS — M25561 Pain in right knee: Secondary | ICD-10-CM | POA: Insufficient documentation

## 2021-09-19 DIAGNOSIS — M2391 Unspecified internal derangement of right knee: Secondary | ICD-10-CM | POA: Insufficient documentation

## 2021-09-22 ENCOUNTER — Ambulatory Visit
Admission: RE | Admit: 2021-09-22 | Discharge: 2021-09-22 | Disposition: A | Discharge status: Home / Self Care | Payer: Medicare HMO | Source: Ambulatory Visit | Attending: Acute Care | Admitting: Acute Care

## 2021-09-22 DIAGNOSIS — Z87891 Personal history of nicotine dependence: Secondary | ICD-10-CM | POA: Insufficient documentation

## 2021-09-22 DIAGNOSIS — F17210 Nicotine dependence, cigarettes, uncomplicated: Secondary | ICD-10-CM | POA: Diagnosis not present

## 2021-09-22 DIAGNOSIS — Z122 Encounter for screening for malignant neoplasm of respiratory organs: Secondary | ICD-10-CM | POA: Diagnosis not present

## 2021-09-25 ENCOUNTER — Other Ambulatory Visit: Payer: Self-pay | Admitting: Acute Care

## 2021-09-25 ENCOUNTER — Encounter: Payer: Self-pay | Admitting: Family Medicine

## 2021-09-25 DIAGNOSIS — Z122 Encounter for screening for malignant neoplasm of respiratory organs: Secondary | ICD-10-CM

## 2021-09-25 DIAGNOSIS — Z87891 Personal history of nicotine dependence: Secondary | ICD-10-CM

## 2021-09-25 DIAGNOSIS — F17210 Nicotine dependence, cigarettes, uncomplicated: Secondary | ICD-10-CM

## 2021-10-02 DIAGNOSIS — M2391 Unspecified internal derangement of right knee: Secondary | ICD-10-CM | POA: Diagnosis not present

## 2021-10-02 DIAGNOSIS — M25561 Pain in right knee: Secondary | ICD-10-CM | POA: Diagnosis not present

## 2021-11-04 DIAGNOSIS — H1045 Other chronic allergic conjunctivitis: Secondary | ICD-10-CM | POA: Diagnosis not present

## 2021-11-04 DIAGNOSIS — H02839 Dermatochalasis of unspecified eye, unspecified eyelid: Secondary | ICD-10-CM | POA: Diagnosis not present

## 2021-11-04 DIAGNOSIS — H26493 Other secondary cataract, bilateral: Secondary | ICD-10-CM | POA: Diagnosis not present

## 2021-11-04 DIAGNOSIS — H353131 Nonexudative age-related macular degeneration, bilateral, early dry stage: Secondary | ICD-10-CM | POA: Diagnosis not present

## 2021-11-14 ENCOUNTER — Encounter: Payer: Self-pay | Admitting: Family Medicine

## 2021-11-14 ENCOUNTER — Ambulatory Visit (INDEPENDENT_AMBULATORY_CARE_PROVIDER_SITE_OTHER): Payer: Medicare HMO | Admitting: Family Medicine

## 2021-11-14 VITALS — Ht 66.0 in | Wt 170.0 lb

## 2021-11-14 DIAGNOSIS — J0110 Acute frontal sinusitis, unspecified: Secondary | ICD-10-CM

## 2021-11-14 MED ORDER — AMOXICILLIN-POT CLAVULANATE 875-125 MG PO TABS: 1.0000 | ORAL_TABLET | Freq: Two times a day (BID) | ORAL | 0 refills | Status: DC

## 2021-11-14 NOTE — Progress Notes (Signed)
Virtual Visit via Telephone The purpose of this virtual visit is to provide medical care while limiting exposure to the novel coronavirus (COVID19) for both patient and office staff.  Consent was obtained for phone visit:  Yes.   Answered questions that patient had about telehealth interaction:  Yes.   I discussed the limitations, risks, security and privacy concerns of performing an evaluation and management service by telephone. I also discussed with the patient that there may be a patient responsible charge related to this service. The patient expressed understanding and agreed to proceed.  Patient Location: Home Provider Location: [facility] (Office)  Participants in virtual visit: - Patient: Julie Pena - CMA: Orinda Kenner, [place] - Provider: Dr Parks Ranger  ---------------------------------------------------------------------- Chief Complaint  Patient presents with   Sinusitis    S: Reviewed CMA documentation. I have called patient and gathered additional HPI as follows:  Sinusitis Reports that symptoms started 1 week ago + she has had sinus drainage congestion pressure, had some dental pain upper R and pain with pressure sinus post nasal drip causes nausea without vomit. She admits pressure worsening, using OTC sinus medication  Denies any fevers, chills, sweats, body ache, cough, shortness of breath, headache, abdominal pain, diarrhea  Past Medical History:  Diagnosis Date   Diverticulosis    Social History   Tobacco Use   Smoking status: Every Day    Packs/day: 0.50    Years: 47.00    Total pack years: 23.50    Types: Cigarettes   Smokeless tobacco: Current   Tobacco comments:    enrolled in smoking cessation class starting 08/2019  Vaping Use   Vaping Use: Never used  Substance Use Topics   Alcohol use: Never   Drug use: Never    Current Outpatient Medications:    amoxicillin-clavulanate (AUGMENTIN) 875-125 MG tablet, Take 1 tablet by  mouth 2 (two) times daily., Disp: 20 tablet, Rfl: 0   Probiotic Product (ALIGN PO), Take by mouth. Take probiotic daily, Disp: , Rfl:      01/22/2021    1:38 PM 01/14/2021    2:12 PM 07/11/2019    3:25 PM  Depression screen PHQ 2/9  Decreased Interest 0 0 0  Down, Depressed, Hopeless 0 0 0  PHQ - 2 Score 0 0 0  Altered sleeping 0    Tired, decreased energy 3    Change in appetite 0    Feeling bad or failure about yourself  0    Trouble concentrating 0    Moving slowly or fidgety/restless 0    Suicidal thoughts 0    PHQ-9 Score 3    Difficult doing work/chores Not difficult at all         01/22/2021    1:39 PM  GAD 7 : Generalized Anxiety Score  Nervous, Anxious, on Edge 0  Control/stop worrying 0  Worry too much - different things 0  Trouble relaxing 0  Restless 0  Easily annoyed or irritable 0  Afraid - awful might happen 0  Total GAD 7 Score 0  Anxiety Difficulty Not difficult at all    -------------------------------------------------------------------------- O: No physical exam performed due to remote telephone encounter.  Lab results reviewed.  No results found for this or any previous visit (from the past 2160 hour(s)).  -------------------------------------------------------------------------- A&P:  Problem List Items Addressed This Visit   None Visit Diagnoses     Acute non-recurrent frontal sinusitis    -  Primary   Relevant Medications  amoxicillin-clavulanate (AUGMENTIN) 875-125 MG tablet      Consistent with acute frontal sinusitis, likely initially viral URI vs allergic rhinitis component with worsening concern for bacterial infection.   Plan: 1. Start Augmentin 875-'125mg'$  PO BID x 10 days 2. Sinus spray continue Return criteria reviewed, defer prednisone, reconsider if needed   Meds ordered this encounter  Medications   amoxicillin-clavulanate (AUGMENTIN) 875-125 MG tablet    Sig: Take 1 tablet by mouth 2 (two) times daily.     Dispense:  20 tablet    Refill:  0    Follow-up: - Return in 1 week PRN  Patient verbalizes understanding with the above medical recommendations including the limitation of remote medical advice.  Specific follow-up and call-back criteria were given for patient to follow-up or seek medical care more urgently if needed.   - Time spent in direct consultation with patient on phone: 7 minutes   Nobie Putnam, [facility] 11/14/2021, 11:59 AM

## 2021-11-14 NOTE — Patient Instructions (Addendum)
1. It sounds like you have a Sinusitis (Bacterial Infection) - this most likely started as an Upper Respiratory Virus that has settled into an infection. Allergies can also cause this. - Start Augmentin 1 pill twice daily (breakfast and dinner, with food and plenty of water) for 10 days, complete entire course, do not stop early even if feeling better - use sinus spray - Improve hydration by drinking plenty of clear fluids (water, gatorade) to reduce secretions and thin congestion - Congestion draining down throat can cause irritation. May try warm herbal tea with honey, cough drops - Can take Tylenol or Ibuprofen as needed for fevers - May continue over the counter cold medicine as you are, I would not use any decongestant or mucinex longer than 7 days.  If you develop persistent fever >101F for at least 3 consecutive days, headaches with sinus pain or pressure or persistent earache, please schedule a follow-up evaluation within next few days to week.    Please schedule a Follow-up Appointment to: Return if symptoms worsen or fail to improve.  If you have any other questions or concerns, please feel free to call the office or send a message through [place]. You may also schedule an earlier appointment if necessary.  Additionally, you may be receiving a survey about your experience at our office within a few days to 1 week by e-mail or mail. We value your feedback.  Nobie Putnam, DO [facility]

## 2021-12-16 DIAGNOSIS — H26493 Other secondary cataract, bilateral: Secondary | ICD-10-CM | POA: Diagnosis not present

## 2021-12-16 DIAGNOSIS — H1045 Other chronic allergic conjunctivitis: Secondary | ICD-10-CM | POA: Diagnosis not present

## 2022-01-20 ENCOUNTER — Ambulatory Visit: Payer: Medicare HMO

## 2022-01-20 DIAGNOSIS — H1045 Other chronic allergic conjunctivitis: Secondary | ICD-10-CM | POA: Diagnosis not present

## 2022-01-20 DIAGNOSIS — H02839 Dermatochalasis of unspecified eye, unspecified eyelid: Secondary | ICD-10-CM | POA: Diagnosis not present

## 2022-01-20 DIAGNOSIS — H26493 Other secondary cataract, bilateral: Secondary | ICD-10-CM | POA: Diagnosis not present

## 2022-01-23 ENCOUNTER — Ambulatory Visit (INDEPENDENT_AMBULATORY_CARE_PROVIDER_SITE_OTHER): Payer: Medicare HMO

## 2022-01-23 VITALS — Ht 66.0 in | Wt 170.0 lb

## 2022-01-23 DIAGNOSIS — Z0000 Encounter for general adult medical examination without abnormal findings: Secondary | ICD-10-CM | POA: Diagnosis not present

## 2022-01-23 NOTE — Progress Notes (Signed)
Virtual Visit via Telephone Note  I connected with  Julie Pena on 01/23/22 at 12:00 PM EDT by telephone and verified that I am speaking with the correct person using two identifiers.  Location: Patient: home Provider: [facility] Persons participating in the virtual visit: [place]   I discussed the limitations, risks, security and privacy concerns of performing an evaluation and management service by telephone and the availability of in person appointments. The patient expressed understanding and agreed to proceed.  Interactive audio and video telecommunications were attempted between this nurse and patient, however failed, due to patient having technical difficulties OR patient did not have access to video capability.  We continued and completed visit with audio only.  Some vital signs may be absent or patient reported.   Dionisio David, LPN  Subjective:   Julie Pena is a 71 y.o. female who presents for Medicare Annual (Subsequent) preventive examination.  Review of Systems     Cardiac Risk Factors include: advanced age (>15mn, >>50women);dyslipidemia;smoking/ tobacco exposure     Objective:    There were no vitals filed for this visit. There is no height or weight on file to calculate BMI.     01/23/2022   11:50 AM 01/14/2021    2:11 PM 09/18/2020    7:40 AM 07/11/2019    3:30 PM  Advanced Directives  Does Patient Have a Medical Advance Directive? No Yes Yes Yes  Type of ACorporate treasurerof ACeloronLiving will HClosterLiving will Living will;Healthcare Power of AFairmountin Chart?  No - copy requested No - copy requested No - copy requested  Would patient like information on creating a medical advance directive? No - Patient declined       Current Medications (verified) Outpatient Encounter Medications as of 01/23/2022  Medication Sig   Probiotic Product (ALIGN PO) Take  by mouth. Take probiotic daily   Probiotic Product (ALIGN) 4 MG CAPS Take 1 capsule by mouth daily.   tobramycin-dexamethasone (TOBRADEX) ophthalmic solution    amoxicillin-clavulanate (AUGMENTIN) 875-125 MG tablet Take 1 tablet by mouth 2 (two) times daily. (Patient not taking: Reported on 01/23/2022)   methylPREDNISolone (MEDROL DOSEPAK) 4 MG TBPK tablet Take by mouth. (Patient not taking: Reported on 01/23/2022)   No facility-administered encounter medications on file as of 01/23/2022.    Allergies (verified) Codeine   History: Past Medical History:  Diagnosis Date   Diverticulosis    Past Surgical History:  Procedure Laterality Date   ABDOMINAL HYSTERECTOMY  1988   Initial partial 1978, then repeat TOTAL 1988   BREAST EXCISIONAL BIOPSY Right mid 1980s   benign   CATARACT EXTRACTION Bilateral 2018   COLONOSCOPY WITH PROPOFOL N/A 09/18/2020   Procedure: COLONOSCOPY WITH PROPOFOL;  Surgeon: VLin Landsman MD;  Location: [facility] ENDOSCOPY;  Service: Gastroenterology;  Laterality: N/A;   DENTAL SURGERY  04/2019   OOPHORECTOMY     TONSILLECTOMY Bilateral 1963   Family History  Problem Relation Age of Onset   Cancer Mother        kidney   Kidney cancer Mother    Cancer Father        lung   Alcohol abuse Father    Depression Father    Lung cancer Father    Colon cancer Neg Hx    Breast cancer Neg Hx    Social History   Socioeconomic History   Marital status: Married  Spouse name: Cadence Minton   Number of children: Not on file   Years of education: Not on file   Highest education level: Not on file  Occupational History   Occupation: retired  Tobacco Use   Smoking status: Every Day    Packs/day: 0.50    Years: 47.00    Total pack years: 23.50    Types: Cigarettes   Smokeless tobacco: Current   Tobacco comments:    enrolled in smoking cessation class starting 08/2019  Vaping Use   Vaping Use: Never used  Substance and Sexual Activity   Alcohol use:  Never   Drug use: Never   Sexual activity: Yes  Other Topics Concern   Not on file  Social History Narrative   Not on file   Social Determinants of Health   Financial Resource Strain: Low Risk  (01/23/2022)   Overall Financial Resource Strain (CARDIA)    Difficulty of Paying Living Expenses: Not hard at all  Food Insecurity: No Food Insecurity (01/23/2022)   Hunger Vital Sign    Worried About Running Out of Food in the Last Year: Never true    [place] in the Last Year: Never true  Transportation Needs: No Transportation Needs (01/23/2022)   PRAPARE - Hydrologist (Medical): No    Lack of Transportation (Non-Medical): No  Physical Activity: Insufficiently Active (01/23/2022)   Exercise Vital Sign    Days of Exercise per Week: 3 days    Minutes of Exercise per Session: 40 min  Stress: No Stress Concern Present (01/23/2022)   [place]    Feeling of Stress : Not at all  Social Connections: Moderately Integrated (01/23/2022)   Social Connection and Isolation Panel [NHANES]    Frequency of Communication with Friends and Family: More than three times a week    Frequency of Social Gatherings with Friends and Family: More than three times a week    Attends Religious Services: More than 4 times per year    Active Member of [employer] or Organizations: No    Attends Archivist Meetings: Never    Marital Status: Married    Tobacco Counseling Ready to quit: Not Answered Counseling given: Not Answered Tobacco comments: enrolled in smoking cessation class starting 08/2019   Clinical Intake:  Pre-visit preparation completed: Yes  Pain : No/denies pain     Nutritional Risks: None Diabetes: No  How often do you need to have someone help you when you read instructions, pamphlets, or other written materials from your doctor or pharmacy?: 1 -  Never  Diabetic?no  Interpreter Needed?: No  Information entered by :: Kirke Shaggy, LPN   Activities of Daily Living    01/23/2022   11:51 AM  In your present state of health, do you have any difficulty performing the following activities:  Hearing? 0  Vision? 0  Difficulty concentrating or making decisions? 0  Walking or climbing stairs? 0  Dressing or bathing? 0  Doing errands, shopping? 0  Preparing Food and eating ? N  Using the Toilet? N  In the past six months, have you accidently leaked urine? N  Do you have problems with loss of bowel control? N  Managing your Medications? N  Managing your Finances? N  Housekeeping or managing your Housekeeping? N    Patient Care Team: Olin Hauser, DO as PCP - General (Family Medicine)  Indicate any recent Medical Services  you may have received from other than [facility] providers in the past year (date may be approximate).     Assessment:   This is a routine wellness examination for Julie Pena.  Hearing/Vision screen Hearing Screening - Comments:: No aids Vision Screening - Comments:: Readers-  Dr.Woodard  Dietary issues and exercise activities discussed: Current Exercise Habits: Home exercise routine, Type of exercise: walking, Time (Minutes): 45, Frequency (Times/Week): 3, Weekly Exercise (Minutes/Week): 135, Intensity: Mild   Goals Addressed             This Visit's Progress    DIET - EAT MORE FRUITS AND VEGETABLES         Depression Screen    01/23/2022   11:49 AM 01/22/2021    1:38 PM 01/14/2021    2:12 PM 07/11/2019    3:25 PM 11/10/2018    8:59 AM 06/29/2018   10:12 AM 08/11/2017   10:33 AM  PHQ 2/9 Scores  PHQ - 2 Score 0 0 0 0 0 0 0  PHQ- 9 Score 0 3         Fall Risk    01/23/2022   11:51 AM 01/22/2021    1:38 PM 01/14/2021    2:12 PM 07/11/2019    3:24 PM 11/10/2018    8:59 AM  [place] in the past year? 0 0 0 0 0  Number falls in past yr: 0 0  0   Injury with Fall? 0 0  0   Risk  for fall due to : No Fall Risks  No Fall Risks    Follow up Falls prevention discussed;Falls evaluation completed Falls evaluation completed Falls evaluation completed;Education provided;Falls prevention discussed  Falls evaluation completed    FALL RISK PREVENTION PERTAINING TO THE HOME:  Any stairs in or around the home? Yes  If so, are there any without handrails? No  Home free of loose throw rugs in walkways, pet beds, electrical cords, etc? Yes  Adequate lighting in your home to reduce risk of falls? Yes   ASSISTIVE DEVICES UTILIZED TO PREVENT FALLS:  Life alert? No  Use of a cane, walker or w/c? No  Grab bars in the bathroom? No  Shower chair or bench in shower? Yes  Elevated toilet seat or a handicapped toilet? Yes   Cognitive Function:        01/23/2022   11:51 AM 01/14/2021    2:13 PM  6CIT Screen  What Year? 0 points 0 points  What month? 0 points 0 points  What time? 0 points 0 points  Count back from 20 0 points 0 points  Months in reverse 0 points 0 points  Repeat phrase 0 points 0 points  Total Score 0 points 0 points    Immunizations Immunization History  Administered Date(s) Administered   Fluad Quad(high Dose 65+) 01/19/2019   Influenza, High Dose Seasonal PF 01/22/2017   Influenza-Unspecified 02/05/2018, 01/10/2020   Pneumococcal Polysaccharide-23 07/31/2020   Tdap 08/25/2017    TDAP status: Up to date  Flu Vaccine status: Due, Education has been provided regarding the importance of this vaccine. Advised may receive this vaccine at local pharmacy or Health Dept. Aware to provide a copy of the vaccination record if obtained from local pharmacy or Health Dept. Verbalized acceptance and understanding.  Pneumococcal vaccine status: Up to date  Covid-19 vaccine status: Declined, Education has been provided regarding the importance of this vaccine but patient still declined. Advised may receive this vaccine at local pharmacy  or Health Dept.or vaccine  clinic. Aware to provide a copy of the vaccination record if obtained from local pharmacy or Health Dept. Verbalized acceptance and understanding.  Qualifies for Shingles Vaccine? Yes   Zostavax completed No   Shingrix Completed?: No.    Education has been provided regarding the importance of this vaccine. Patient has been advised to call insurance company to determine out of pocket expense if they have not yet received this vaccine. Advised may also receive vaccine at local pharmacy or Health Dept. Verbalized acceptance and understanding.  Screening Tests Health Maintenance  Topic Date Due   COVID-19 Vaccine (1) Never done   Zoster Vaccines- Shingrix (1 of 2) Never done   Pneumonia Vaccine 35+ Years old (2 - PCV) 07/31/2021   INFLUENZA VACCINE  11/04/2021   MAMMOGRAM  08/28/2022   COLONOSCOPY (Pts 45-54yr Insurance coverage will need to be confirmed)  09/19/2023   TETANUS/TDAP  08/26/2027   DEXA SCAN  Completed   Hepatitis C Screening  Completed   HPV VACCINES  Aged Out   Fecal DNA (Cologuard)  Discontinued    Health Maintenance  Health Maintenance Due  Topic Date Due   COVID-19 Vaccine (1) Never done   Zoster Vaccines- Shingrix (1 of 2) Never done   Pneumonia Vaccine 71 Years old (2 - PCV) 07/31/2021   INFLUENZA VACCINE  11/04/2021    Colorectal cancer screening: Type of screening: Colonoscopy. Completed 09/18/20. Repeat every 3 years  Mammogram status: Completed 08/27/21. Repeat every year  Bone Density status: Completed 08/01/20. Results reflect: Bone density results: NORMAL. Repeat every 5 years.  Lung Cancer Screening: (Low Dose CT Chest recommended if Age 71-80years, 30 pack-year currently smoking OR have quit w/in 15years.) does qualify.    Additional Screening:  Hepatitis C Screening: does qualify; Completed 08/18/17  Vision Screening: Recommended annual ophthalmology exams for early detection of glaucoma and other disorders of the eye. Is the patient up to date  with their annual eye exam?  Yes  Who is the provider or what is the name of the office in which the patient attends annual eye exams? Dr.Woodard If pt is not established with a provider, would they like to be referred to a provider to establish care? No .   Dental Screening: Recommended annual dental exams for proper oral hygiene  Community Resource Referral / Chronic Care Management: CRR required this visit?  No   CCM required this visit?  No      Plan:     I have personally reviewed and noted the following in the patient's chart:   Medical and social history Use of alcohol, tobacco or illicit drugs  Current medications and supplements including opioid prescriptions. Patient is not currently taking opioid prescriptions. Functional ability and status Nutritional status Physical activity Advanced directives List of other physicians Hospitalizations, surgeries, and ER visits in previous 12 months Vitals Screenings to include cognitive, depression, and falls Referrals and appointments  In addition, I have reviewed and discussed with patient certain preventive protocols, quality metrics, and best practice recommendations. A written personalized care plan for preventive services as well as general preventive health recommendations were provided to patient.     LDionisio David LPN   [id number]  Nurse Notes: none

## 2022-01-23 NOTE — Patient Instructions (Signed)
Ms. Julie Pena , Thank you for taking time to come for your Medicare Wellness Visit. I appreciate your ongoing commitment to your health goals. Please review the following plan we discussed and let me know if I can assist you in the future.   Screening recommendations/referrals: Colonoscopy: 09/18/20 Mammogram: 08/27/21 Bone Density: 08/01/20 Recommended yearly ophthalmology/optometry visit for glaucoma screening and checkup Recommended yearly dental visit for hygiene and checkup  Vaccinations: Influenza vaccine: n/d Pneumococcal vaccine: 07/31/20 Tdap vaccine: 08/25/17 Shingles vaccine: n/d   Covid-19:n/d  Advanced directives: no  Conditions/risks identified: none  Next appointment: Follow up in one year for your annual wellness visit 01/29/23 @ 11 am by phone   Preventive Care 65 Years and Older, Female Preventive care refers to lifestyle choices and visits with your health care provider that can promote health and wellness. What does preventive care include? A yearly physical exam. This is also called an annual well check. Dental exams once or twice a year. Routine eye exams. Ask your health care provider how often you should have your eyes checked. Personal lifestyle choices, including: Daily care of your teeth and gums. Regular physical activity. Eating a healthy diet. Avoiding tobacco and drug use. Limiting alcohol use. Practicing safe sex. Taking low-dose aspirin every day. Taking vitamin and mineral supplements as recommended by your health care provider. What happens during an annual well check? The services and screenings done by your health care provider during your annual well check will depend on your age, overall health, lifestyle risk factors, and family history of disease. Counseling  Your health care provider may ask you questions about your: Alcohol use. Tobacco use. Drug use. Emotional well-being. Home and relationship well-being. Sexual activity. Eating  habits. History of falls. Memory and ability to understand (cognition). Work and work Statistician. Reproductive health. Screening  You may have the following tests or measurements: Height, weight, and BMI. Blood pressure. Lipid and cholesterol levels. These may be checked every 5 years, or more frequently if you are over 35 years old. Skin check. Lung cancer screening. You may have this screening every year starting at age 41 if you have a 30-pack-year history of smoking and currently smoke or have quit within the past 15 years. Fecal occult blood test (FOBT) of the stool. You may have this test every year starting at age 98. Flexible sigmoidoscopy or colonoscopy. You may have a sigmoidoscopy every 5 years or a colonoscopy every 10 years starting at age 19. Hepatitis C blood test. Hepatitis B blood test. Sexually transmitted disease (STD) testing. Diabetes screening. This is done by checking your blood sugar (glucose) after you have not eaten for a while (fasting). You may have this done every 1-3 years. Bone density scan. This is done to screen for osteoporosis. You may have this done starting at age 48. Mammogram. This may be done every 1-2 years. Talk to your health care provider about how often you should have regular mammograms. Talk with your health care provider about your test results, treatment options, and if necessary, the need for more tests. Vaccines  Your health care provider may recommend certain vaccines, such as: Influenza vaccine. This is recommended every year. Tetanus, diphtheria, and acellular pertussis (Tdap, Td) vaccine. You may need a Td booster every 10 years. Zoster vaccine. You may need this after age 49. Pneumococcal 13-valent conjugate (PCV13) vaccine. One dose is recommended after age 67. Pneumococcal polysaccharide (PPSV23) vaccine. One dose is recommended after age 4. Talk to your health care provider about  which screenings and vaccines you need and how  often you need them. This information is not intended to replace advice given to you by your health care provider. Make sure you discuss any questions you have with your health care provider. Document Released: 04/19/2015 Document Revised: 12/11/2015 Document Reviewed: 01/22/2015 Elsevier Interactive Patient Education  2017 [place] Prevention in the Home Falls can cause injuries. They can happen to people of all ages. There are many things you can do to make your home safe and to help prevent falls. What can I do on the outside of my home? Regularly fix the edges of walkways and driveways and fix any cracks. Remove anything that might make you trip as you walk through a door, such as a raised step or threshold. Trim any bushes or trees on the path to your home. Use bright outdoor lighting. Clear any walking paths of anything that might make someone trip, such as rocks or tools. Regularly check to see if handrails are loose or broken. Make sure that both sides of any steps have handrails. Any raised decks and porches should have guardrails on the edges. Have any leaves, snow, or ice cleared regularly. Use sand or salt on walking paths during winter. Clean up any spills in your garage right away. This includes oil or grease spills. What can I do in the bathroom? Use night lights. Install grab bars by the toilet and in the tub and shower. Do not use towel bars as grab bars. Use non-skid mats or decals in the tub or shower. If you need to sit down in the shower, use a plastic, non-slip stool. Keep the floor dry. Clean up any water that spills on the floor as soon as it happens. Remove soap buildup in the tub or shower regularly. Attach bath mats securely with double-sided non-slip rug tape. Do not have throw rugs and other things on the floor that can make you trip. What can I do in the bedroom? Use night lights. Make sure that you have a light by your bed that is easy to  reach. Do not use any sheets or blankets that are too big for your bed. They should not hang down onto the floor. Have a firm chair that has side arms. You can use this for support while you get dressed. Do not have throw rugs and other things on the floor that can make you trip. What can I do in the kitchen? Clean up any spills right away. Avoid walking on wet floors. Keep items that you use a lot in easy-to-reach places. If you need to reach something above you, use a strong step stool that has a grab bar. Keep electrical cords out of the way. Do not use floor polish or wax that makes floors slippery. If you must use wax, use non-skid floor wax. Do not have throw rugs and other things on the floor that can make you trip. What can I do with my stairs? Do not leave any items on the stairs. Make sure that there are handrails on both sides of the stairs and use them. Fix handrails that are broken or loose. Make sure that handrails are as long as the stairways. Check any carpeting to make sure that it is firmly attached to the stairs. Fix any carpet that is loose or worn. Avoid having throw rugs at the top or bottom of the stairs. If you do have throw rugs, attach them to the floor with  carpet tape. Make sure that you have a light switch at the top of the stairs and the bottom of the stairs. If you do not have them, ask someone to add them for you. What else can I do to help prevent falls? Wear shoes that: Do not have high heels. Have rubber bottoms. Are comfortable and fit you well. Are closed at the toe. Do not wear sandals. If you use a stepladder: Make sure that it is fully opened. Do not climb a closed stepladder. Make sure that both sides of the stepladder are locked into place. Ask someone to hold it for you, if possible. Clearly mark and make sure that you can see: Any grab bars or handrails. First and last steps. Where the edge of each step is. Use tools that help you move  around (mobility aids) if they are needed. These include: Canes. Walkers. Scooters. Crutches. Turn on the lights when you go into a dark area. Replace any light bulbs as soon as they burn out. Set up your furniture so you have a clear path. Avoid moving your furniture around. If any of your floors are uneven, fix them. If there are any pets around you, be aware of where they are. Review your medicines with your doctor. Some medicines can make you feel dizzy. This can increase your chance of falling. Ask your doctor what other things that you can do to help prevent falls. This information is not intended to replace advice given to you by your health care provider. Make sure you discuss any questions you have with your health care provider. Document Released: 01/17/2009 Document Revised: 08/29/2015 Document Reviewed: 04/27/2014 Elsevier Interactive Patient Education  2017 [employer].

## 2022-02-02 ENCOUNTER — Ambulatory Visit (INDEPENDENT_AMBULATORY_CARE_PROVIDER_SITE_OTHER): Payer: Medicare HMO | Admitting: Physician Assistant

## 2022-02-02 ENCOUNTER — Encounter: Payer: Self-pay | Admitting: Physician Assistant

## 2022-02-02 VITALS — BP 154/73 | HR 91 | Temp 96.8°F | Wt 176.0 lb

## 2022-02-02 DIAGNOSIS — J0100 Acute maxillary sinusitis, unspecified: Secondary | ICD-10-CM

## 2022-02-02 MED ORDER — DOXYCYCLINE HYCLATE 100 MG PO TABS: 100.0000 mg | ORAL_TABLET | Freq: Two times a day (BID) | ORAL | 0 refills | Status: AC

## 2022-02-02 NOTE — Progress Notes (Signed)
Acute Office Visit   Patient: Julie Pena   DOB: [birth year]   71 y.o. Female  MRN: [id number] Visit Date: 02/02/2022  Today's healthcare provider: Dani Gobble Antaniya Venuti, PA-C  Introduced myself to the patient as a Journalist, newspaper and provided education on APPs in clinical practice.    Chief Complaint  Patient presents with   Sinusitis    Reports she did not fully recover from sinusitis in Aug  She started developing eye drainage and itching - was given rx by eye doctor for this.   Subjective    HPI HPI     Sinusitis    Additional comments: Reports she did not fully recover from sinusitis in Aug  She started developing eye drainage and itching - was given rx by eye doctor for this.      Last edited by Almon Register, PA-C on 02/02/2022  1:43 PM.       Sinusitis Reports she is having sinus pressure and pain - states her teeth have been hurting She reports she started developing eye pain and drainage - went to eye doctor for this and was given eye drops to manage Reports she feels overall tired and run down Reports her ears feel full and she feels like her head is under a lot of pressure  Denies fevers,   She has not tested for COVID  She has been using Allegra, Nasacort netipot to assist with symptoms  She has been trying ear drops to assist with ear fullness     Medications: Outpatient Medications Prior to Visit  Medication Sig   Probiotic Product (ALIGN PO) Take by mouth. Take probiotic daily   tobramycin-dexamethasone (TOBRADEX) ophthalmic solution    [DISCONTINUED] amoxicillin-clavulanate (AUGMENTIN) 875-125 MG tablet Take 1 tablet by mouth 2 (two) times daily.   [DISCONTINUED] methylPREDNISolone (MEDROL DOSEPAK) 4 MG TBPK tablet Take by mouth.   [DISCONTINUED] Probiotic Product (ALIGN) 4 MG CAPS Take 1 capsule by mouth daily.   No facility-administered medications prior to visit.    Review of Systems  Constitutional:  Positive for fatigue. Negative for chills and  fever.  HENT:  Positive for congestion, ear pain, postnasal drip, sinus pressure, sinus pain and sore throat (intermittent).   Eyes:  Positive for discharge.       Reports gritty feeling in eyes and swelling   Respiratory:  Negative for cough, shortness of breath and wheezing.   Gastrointestinal:  Positive for nausea. Negative for diarrhea and vomiting.  Musculoskeletal:  Negative for myalgias.  Neurological:  Negative for headaches.       Objective    BP (!) 154/73 (BP Location: Left Arm, Patient Position: Sitting, Cuff Size: Normal)   Pulse 91   Temp (!) 96.8 F (36 C) (Temporal)   Wt 176 lb (79.8 kg)   SpO2 100%   BMI 28.41 kg/m    Physical Exam Vitals reviewed.  Constitutional:      General: She is awake.     Appearance: Normal appearance. She is well-developed and well-groomed.  HENT:     Head: Normocephalic and atraumatic.     Right Ear: Ear canal and external ear normal. A middle ear effusion is present. Tympanic membrane is erythematous and bulging. Tympanic membrane is not retracted.     Left Ear: Ear canal and external ear normal. A middle ear effusion is present. Tympanic membrane is erythematous. Tympanic membrane is not retracted or bulging.     Ears:  Comments:  Mildly erythematous TM bilaterally due to lavage  Slightly bulging and appears to have mid ear effusion on both sides    Mouth/Throat:     Mouth: Mucous membranes are moist.     Pharynx: Oropharynx is clear. Uvula midline. No pharyngeal swelling, oropharyngeal exudate or posterior oropharyngeal erythema.  Cardiovascular:     Rate and Rhythm: Normal rate and regular rhythm.     Pulses: Normal pulses.          Radial pulses are 2+ on the right side and 2+ on the left side.     Heart sounds: Normal heart sounds. No murmur heard.    No friction rub. No gallop.  Pulmonary:     Effort: Pulmonary effort is normal.     Breath sounds: Normal breath sounds. No decreased air movement. No decreased breath  sounds, wheezing, rhonchi or rales.  Lymphadenopathy:     Head:     Right side of head: No submental, submandibular or preauricular adenopathy.     Left side of head: No submental, submandibular or preauricular adenopathy.     Cervical:     Right cervical: No superficial or posterior cervical adenopathy.    Left cervical: No superficial or posterior cervical adenopathy.     Upper Body:     Right upper body: No supraclavicular adenopathy.     Left upper body: No supraclavicular adenopathy.  Neurological:     Mental Status: She is alert.  Psychiatric:        Behavior: Behavior is cooperative.       No results found for any visits on 02/02/22.  Assessment & Plan      No follow-ups on file.      Problem List Items Addressed This Visit   None Visit Diagnoses     Acute non-recurrent maxillary sinusitis    -  Primary Acute, new concern Reports nasal congestion, sinus pain and pressure, eye drainage for the past 2 weeks with increasing severity Reports symptoms are not improving despite home therapies Will start Doxycycline 100 mg PO BID x 7days for sinusitis  Recommend she uses Mucinex and drinks plenty of water to further assist with symptoms She can also continue to use netipot and nasacort as tolerated Follow up as needed for persistent or progressing symptoms     Relevant Medications   doxycycline (VIBRA-TABS) 100 MG tablet        No follow-ups on file.   I, Renato Spellman E Davell Beckstead, PA-C, have reviewed all documentation for this visit. The documentation on 02/02/22 for the exam, diagnosis, procedures, and orders are all accurate and complete.   Talitha Givens, MHS, PA-C [facility]

## 2022-02-23 ENCOUNTER — Ambulatory Visit (INDEPENDENT_AMBULATORY_CARE_PROVIDER_SITE_OTHER): Payer: Medicare HMO | Admitting: Family Medicine

## 2022-02-23 ENCOUNTER — Encounter: Payer: Self-pay | Admitting: Family Medicine

## 2022-02-23 VITALS — Ht 66.0 in | Wt 176.0 lb

## 2022-02-23 DIAGNOSIS — J0110 Acute frontal sinusitis, unspecified: Secondary | ICD-10-CM | POA: Diagnosis not present

## 2022-02-23 DIAGNOSIS — J069 Acute upper respiratory infection, unspecified: Secondary | ICD-10-CM | POA: Diagnosis not present

## 2022-02-23 NOTE — Progress Notes (Addendum)
Virtual Visit via Telephone The purpose of this virtual visit is to provide medical care while limiting exposure to the novel coronavirus (COVID19) for both patient and office staff.  Consent was obtained for phone visit:  Yes.   Answered questions that patient had about telehealth interaction:  Yes.   I discussed the limitations, risks, security and privacy concerns of performing an evaluation and management service by telephone. I also discussed with the patient that there may be a patient responsible charge related to this service. The patient expressed understanding and agreed to proceed.  Patient Location: Home Provider Location: [facility] (Office)  Participants in virtual visit: - Patient: Julie Pena - CMA: Orinda Kenner, [place] - Provider: Dr Parks Ranger  ---------------------------------------------------------------------- Chief Complaint  Patient presents with   Sore Throat   Cough    S: Reviewed CMA documentation. I have called patient and gathered additional HPI as follows:  Sinusitis vs Viral URI COUGH  > 1 week with sinusitis productive cough, she woke up recently with coughing spell overnight woke up with coughing and chest discomfort from coughing.  She states that her husband cannot mow the grass, advised by ENT. Now she is left to mow. She was wearing a mask, goggles, and toboggan cap. She mowed on Friday. So she started feeling worse on Saturday. Next day. They plan to hire neighbor for lawn mowing going forward.  Not running fever. May have higher temp at night, will wake up sweaty.  Reduced appetite. Eating crackers to take Advil and Dimetap.  COVID testing tomorrow 02/24/22 here drive up testing. She has family coming in for thanksgiving and does not want to be sick  Denies any known or suspected exposure to person with or possibly with [place].  Denies any fevers, chills, body ache, sinus pain or pressure, headache, abdominal pain,  diarrhea  Past Medical History:  Diagnosis Date   Diverticulosis    Social History   Tobacco Use   Smoking status: Every Day    Packs/day: 0.50    Years: 47.00    Total pack years: 23.50    Types: Cigarettes   Smokeless tobacco: Current   Tobacco comments:    enrolled in smoking cessation class starting 08/2019  Vaping Use   Vaping Use: Never used  Substance Use Topics   Alcohol use: Never   Drug use: Never    Current Outpatient Medications:    Probiotic Product (ALIGN PO), Take by mouth. Take probiotic daily, Disp: , Rfl:    tobramycin-dexamethasone (TOBRADEX) ophthalmic solution, , Disp: , Rfl:      01/23/2022   11:49 AM 01/22/2021    1:38 PM 01/14/2021    2:12 PM  Depression screen PHQ 2/9  Decreased Interest 0 0 0  Down, Depressed, Hopeless 0 0 0  PHQ - 2 Score 0 0 0  Altered sleeping 0 0   Tired, decreased energy 0 3   Change in appetite 0 0   Feeling bad or failure about yourself  0 0   Trouble concentrating 0 0   Moving slowly or fidgety/restless 0 0   Suicidal thoughts 0 0   PHQ-9 Score 0 3   Difficult doing work/chores Not difficult at all Not difficult at all        01/22/2021    1:39 PM  GAD 7 : Generalized Anxiety Score  Nervous, Anxious, on Edge 0  Control/stop worrying 0  Worry too much - different things 0  Trouble relaxing 0  Restless 0  Easily annoyed or irritable 0  Afraid - awful might happen 0  Total GAD 7 Score 0  Anxiety Difficulty Not difficult at all    -------------------------------------------------------------------------- O: No physical exam performed due to remote telephone encounter.  Lab results reviewed.  No results found for this or any previous visit (from the past 2160 hour(s)).  -------------------------------------------------------------------------- A&P:  Problem List Items Addressed This Visit   None Visit Diagnoses     Acute non-recurrent frontal sinusitis    -  Primary   URI with cough and  congestion          Possible COVID-19 viral infection vs URI vs Sinusitis 2 day onset Afebrile but some possible fever at night reported not recorded  Return to office tomorrow 11/21 Tues for drive up COVID rapid testing swab Call her back with results and rx decision  In past has required antibiotics to resolve sinusitis x 2 in past 3 months  If COVID positive, will treat accordingly If COVID negative, will consider repeat antibiotic course, and further medication options such as cough syrup, nasal spray, consider prednisone as back up for bronchitis.  Update 02/24/22 COVID swab negative today  Order Augmentin, Prednisone taper, and Tussionex cough syrup.  No orders of the defined types were placed in this encounter.   Follow-up: - Return in 1 day for drive up COVID testing.  Patient verbalizes understanding with the above medical recommendations including the limitation of remote medical advice.  Specific follow-up and call-back criteria were given for patient to follow-up or seek medical care more urgently if needed.   - Time spent in direct consultation with patient on phone: 10 minutes   Nobie Putnam, [facility] 02/23/2022, 4:40 PM

## 2022-02-23 NOTE — Patient Instructions (Addendum)
Drive up lab [id number] test at our office tomorrow  Please schedule a Follow-up Appointment to: No follow-ups on file.  If you have any other questions or concerns, please feel free to call the office or send a message through [place]. You may also schedule an earlier appointment if necessary.  Additionally, you may be receiving a survey about your experience at our office within a few days to 1 week by e-mail or mail. We value your feedback.  Nobie Putnam, DO [place]

## 2022-02-24 MED ORDER — HYDROCOD POLI-CHLORPHE POLI ER 10-8 MG/5ML PO SUER: 5.0000 mL | Freq: Two times a day (BID) | ORAL | 0 refills | Status: DC | PRN

## 2022-02-24 MED ORDER — AMOXICILLIN-POT CLAVULANATE 875-125 MG PO TABS: 1.0000 | ORAL_TABLET | Freq: Two times a day (BID) | ORAL | 0 refills | Status: DC

## 2022-02-24 MED ORDER — PREDNISONE 10 MG PO TABS: ORAL_TABLET | ORAL | 0 refills | Status: DC

## 2022-02-24 NOTE — Addendum Note (Signed)
Addended by: Olin Hauser on: 02/24/2022 01:20 PM   Modules accepted: Orders

## 2022-02-25 LAB — POC COVID19 BINAXNOW: SARS Coronavirus 2 Ag: NEGATIVE

## 2022-02-25 NOTE — Addendum Note (Signed)
Addended by: Orinda Kenner on: 02/25/2022 09:04 AM   Modules accepted: Orders

## 2022-06-08 DIAGNOSIS — H353131 Nonexudative age-related macular degeneration, bilateral, early dry stage: Secondary | ICD-10-CM | POA: Diagnosis not present

## 2022-06-08 DIAGNOSIS — H02839 Dermatochalasis of unspecified eye, unspecified eyelid: Secondary | ICD-10-CM | POA: Diagnosis not present

## 2022-06-08 DIAGNOSIS — H1045 Other chronic allergic conjunctivitis: Secondary | ICD-10-CM | POA: Diagnosis not present

## 2022-06-08 DIAGNOSIS — H26493 Other secondary cataract, bilateral: Secondary | ICD-10-CM | POA: Diagnosis not present

## 2022-07-08 DIAGNOSIS — H1045 Other chronic allergic conjunctivitis: Secondary | ICD-10-CM | POA: Diagnosis not present

## 2022-07-08 DIAGNOSIS — H353131 Nonexudative age-related macular degeneration, bilateral, early dry stage: Secondary | ICD-10-CM | POA: Diagnosis not present

## 2022-07-08 DIAGNOSIS — H26493 Other secondary cataract, bilateral: Secondary | ICD-10-CM | POA: Diagnosis not present

## 2022-07-08 DIAGNOSIS — H11441 Conjunctival cysts, right eye: Secondary | ICD-10-CM | POA: Diagnosis not present

## 2022-07-21 DIAGNOSIS — H11443 Conjunctival cysts, bilateral: Secondary | ICD-10-CM | POA: Diagnosis not present

## 2022-07-21 DIAGNOSIS — H353131 Nonexudative age-related macular degeneration, bilateral, early dry stage: Secondary | ICD-10-CM | POA: Diagnosis not present

## 2022-07-21 DIAGNOSIS — H1045 Other chronic allergic conjunctivitis: Secondary | ICD-10-CM | POA: Diagnosis not present

## 2022-07-21 DIAGNOSIS — H26493 Other secondary cataract, bilateral: Secondary | ICD-10-CM | POA: Diagnosis not present

## 2022-08-10 DIAGNOSIS — H11441 Conjunctival cysts, right eye: Secondary | ICD-10-CM | POA: Diagnosis not present

## 2022-08-10 DIAGNOSIS — J328 Other chronic sinusitis: Secondary | ICD-10-CM | POA: Diagnosis not present

## 2022-08-10 DIAGNOSIS — J310 Chronic rhinitis: Secondary | ICD-10-CM | POA: Diagnosis not present

## 2022-08-21 DIAGNOSIS — C6901 Malignant neoplasm of right conjunctiva: Secondary | ICD-10-CM | POA: Diagnosis not present

## 2022-08-21 DIAGNOSIS — C8301 Small cell B-cell lymphoma, lymph nodes of head, face, and neck: Secondary | ICD-10-CM | POA: Diagnosis not present

## 2022-08-21 DIAGNOSIS — C8510 Unspecified B-cell lymphoma, unspecified site: Secondary | ICD-10-CM | POA: Diagnosis not present

## 2022-09-04 DIAGNOSIS — C6901 Malignant neoplasm of right conjunctiva: Secondary | ICD-10-CM | POA: Diagnosis not present

## 2022-09-07 DIAGNOSIS — C8599 Non-Hodgkin lymphoma, unspecified, extranodal and solid organ sites: Secondary | ICD-10-CM | POA: Diagnosis not present

## 2022-09-16 DIAGNOSIS — R799 Abnormal finding of blood chemistry, unspecified: Secondary | ICD-10-CM | POA: Diagnosis not present

## 2022-09-16 DIAGNOSIS — Z1159 Encounter for screening for other viral diseases: Secondary | ICD-10-CM | POA: Diagnosis not present

## 2022-09-16 DIAGNOSIS — C6901 Malignant neoplasm of right conjunctiva: Secondary | ICD-10-CM | POA: Diagnosis not present

## 2022-09-16 DIAGNOSIS — C8581 Other specified types of non-Hodgkin lymphoma, lymph nodes of head, face, and neck: Secondary | ICD-10-CM | POA: Diagnosis not present

## 2022-09-16 DIAGNOSIS — C8599 Non-Hodgkin lymphoma, unspecified, extranodal and solid organ sites: Secondary | ICD-10-CM | POA: Diagnosis not present

## 2022-09-16 DIAGNOSIS — Z114 Encounter for screening for human immunodeficiency virus [HIV]: Secondary | ICD-10-CM | POA: Diagnosis not present

## 2022-09-24 ENCOUNTER — Ambulatory Visit: Payer: Medicare HMO

## 2022-10-18 ENCOUNTER — Emergency Department
Admission: EM | Admit: 2022-10-18 | Discharge: 2022-10-18 | Disposition: A | Discharge status: Home / Self Care | Payer: Medicare HMO | Attending: Emergency Medicine | Admitting: Emergency Medicine

## 2022-10-18 ENCOUNTER — Encounter: Payer: Self-pay | Admitting: Emergency Medicine

## 2022-10-18 ENCOUNTER — Other Ambulatory Visit: Payer: Self-pay

## 2022-10-18 ENCOUNTER — Emergency Department: Payer: Medicare HMO

## 2022-10-18 DIAGNOSIS — R9431 Abnormal electrocardiogram [ECG] [EKG]: Secondary | ICD-10-CM | POA: Diagnosis not present

## 2022-10-18 DIAGNOSIS — K5792 Diverticulitis of intestine, part unspecified, without perforation or abscess without bleeding: Secondary | ICD-10-CM

## 2022-10-18 DIAGNOSIS — R1032 Left lower quadrant pain: Secondary | ICD-10-CM | POA: Diagnosis not present

## 2022-10-18 DIAGNOSIS — R109 Unspecified abdominal pain: Secondary | ICD-10-CM | POA: Diagnosis not present

## 2022-10-18 DIAGNOSIS — K5732 Diverticulitis of large intestine without perforation or abscess without bleeding: Secondary | ICD-10-CM | POA: Insufficient documentation

## 2022-10-18 DIAGNOSIS — Q630 Accessory kidney: Secondary | ICD-10-CM | POA: Diagnosis not present

## 2022-10-18 DIAGNOSIS — Z8572 Personal history of non-Hodgkin lymphomas: Secondary | ICD-10-CM | POA: Diagnosis not present

## 2022-10-18 LAB — COMPREHENSIVE METABOLIC PANEL
ALT: 16 U/L (ref 0–44)
AST: 16 U/L (ref 15–41)
Albumin: 4.3 g/dL (ref 3.5–5.0)
Alkaline Phosphatase: 75 U/L (ref 38–126)
Anion gap: 7 (ref 5–15)
BUN: 14 mg/dL (ref 8–23)
CO2: 24 mmol/L (ref 22–32)
Calcium: 8.9 mg/dL (ref 8.9–10.3)
Chloride: 107 mmol/L (ref 98–111)
Creatinine, Ser: 0.73 mg/dL (ref 0.44–1.00)
GFR, Estimated: >60 mL/min (ref >60)
Glucose, Bld: 113 mg/dL — ABNORMAL HIGH (ref 70–99)
Potassium: 3.9 mmol/L (ref 3.5–5.1)
Sodium: 138 mmol/L (ref 135–145)
Total Bilirubin: 0.9 mg/dL (ref 0.3–1.2)
Total Protein: 6.9 g/dL (ref 6.5–8.1)

## 2022-10-18 LAB — CBC
HCT: 36.3 % (ref 36.0–46.0)
Hemoglobin: 12.4 g/dL (ref 12.0–15.0)
MCH: 30.8 pg (ref 26.0–34.0)
MCHC: 34.2 g/dL (ref 30.0–36.0)
MCV: 90.1 fL (ref 80.0–100.0)
Platelets: 156 10*3/uL (ref 150–400)
RBC: 4.03 MIL/uL (ref 3.87–5.11)
RDW: 13.6 % (ref 11.5–15.5)
WBC: 8.7 10*3/uL (ref 4.0–10.5)
nRBC: 0 % (ref 0.0–0.2)

## 2022-10-18 LAB — URINALYSIS, ROUTINE W REFLEX MICROSCOPIC
Bacteria, UA: NONE SEEN
Bilirubin Urine: NEGATIVE
Glucose, UA: NEGATIVE mg/dL
Ketones, ur: NEGATIVE mg/dL
Nitrite: NEGATIVE
Protein, ur: NEGATIVE mg/dL
Specific Gravity, Urine: 1.01 (ref 1.005–1.030)
pH: 5 (ref 5.0–8.0)

## 2022-10-18 LAB — LIPASE, BLOOD: Lipase: 29 U/L (ref 11–51)

## 2022-10-18 MED ORDER — AMOXICILLIN-POT CLAVULANATE 875-125 MG PO TABS
1.0000 | ORAL_TABLET | Freq: Once | ORAL | Status: AC
  Administered 2022-10-18: 1 via ORAL
  Filled 2022-10-18: qty 1

## 2022-10-18 MED ORDER — AMOXICILLIN-POT CLAVULANATE 875-125 MG PO TABS: 1.0000 | ORAL_TABLET | Freq: Two times a day (BID) | ORAL | 0 refills | Status: AC

## 2022-10-18 NOTE — ED Triage Notes (Addendum)
Pt reports yesterday started with pain to her left flank. Pt reports relieved with advil and tylenol but then comes back. Pt reports the pain has intermittent episodes of worsening and feels like a knife at times. Pt reports pain does not radiate. Pt also reports today she started feeling some urinary urgency as well. Denies fevers, VD, but reports does feel nauseated. 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

## 2022-10-18 NOTE — ED Provider Notes (Signed)
[facility] Provider Note    Event Date/Time   First MD Initiated Contact with Patient 10/18/22 1346     (approximate)   History   Abdominal Pain   HPI  Julie Pena is a 72 y.o. female with a history of lymphoma who presents with left lower quadrant/left flank pain since last night, acute onset, somewhat intermittent, nonradiating, and relieved by ibuprofen for a few hours.  The patient denies any nausea or vomiting, diarrhea, other change in her bowel movements, dysuria or frequency although does report some mild urgency/hesitancy.  She has no fever or chills.  I reviewed the past medical records.  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   Physical Exam   Triage Vital Signs: ED Triage Vitals  Encounter Vitals Group     BP 10/18/22 1302 (!) 162/69     Systolic BP Percentile --      Diastolic BP Percentile --      Pulse Rate 10/18/22 1302 98     Resp 10/18/22 1302 20     Temp 10/18/22 1302 98.3 F (36.8 C)     Temp Source 10/18/22 1302 Oral     SpO2 10/18/22 1302 97 %     Weight 10/18/22 1303 160 lb (72.6 kg)     Height 10/18/22 1303 5\' 6"  (1.676 m)     Head Circumference --      Peak Flow --      Pain Score 10/18/22 1302 8     Pain Loc --      Pain Education --      Exclude from Growth Chart --     Most recent vital signs: Vitals:   10/18/22 1302  BP: (!) 162/69  Pulse: 98  Resp: 20  Temp: 98.3 F (36.8 C)  SpO2: 97%     General: Awake, comfortable appearing, no distress.  CV:  Good peripheral perfusion.  Resp:  Normal effort.  Abd:  Soft with mild left lower quadrant/left flank discomfort.  No distention.  Other:  No jaundice or scleral icterus.   ED Results / Procedures / Treatments   Labs (all labs ordered are listed, but only abnormal results are displayed) Labs Reviewed  COMPREHENSIVE METABOLIC PANEL - Abnormal; Notable for the following components:      Result  Value   Glucose, Bld 113 (*)    All other components within normal limits  URINALYSIS, ROUTINE W REFLEX MICROSCOPIC - Abnormal; Notable for the following components:   Color, Urine YELLOW (*)    APPearance CLEAR (*)    Hgb urine dipstick SMALL (*)    Leukocytes,Ua TRACE (*)    All other components within normal limits  LIPASE, BLOOD  CBC     EKG  ED ECG REPORT I, Dionne Bucy, the attending physician, personally viewed and interpreted this ECG.  Date: 10/18/2022 EKG Time: 1309 Rate: 96 Rhythm: normal sinus rhythm QRS Axis: normal Intervals: normal ST/T Wave abnormalities: normal Narrative Interpretation: no evidence of acute ischemia    RADIOLOGY  CT abdomen/pelvis: I independently viewed and interpreted the images; there is no visible ureteral stone.  Radiology impression is as follows:  IMPRESSION:  1. Diverticulosis of the descending and sigmoid colon, with moderate  descending colon acute diverticulitis with local inflammation and  trace fluid in the left paracolic gutter. No extraluminal gas or  abscess.  2. Aortic atherosclerosis.  3. Small amount of gas in the  urinary bladder, query recent  catheterization.  4. Lumbar impingement at L3-4, L4-5, and L5-S1.  5. Duplicated left renal collecting system.  6. Diffuse hepatic steatosis.  7. Mitral valve calcification.    PROCEDURES:  Critical Care performed: No  Procedures   MEDICATIONS ORDERED IN ED: Medications  amoxicillin-clavulanate (AUGMENTIN) 875-125 MG per tablet 1 tablet (1 tablet Oral Given 10/18/22 1644)     IMPRESSION / MDM / ASSESSMENT AND PLAN / ED COURSE  I reviewed the triage vital signs and the nursing notes.  72 year old female with PMH as noted above presents with left lower quadrant and flank pain since yesterday with no significant associated symptoms and mild tenderness on exam.  She is overall well-appearing.  Vital signs are normal except for hypertension.  Differential  diagnosis includes, but is not limited to, ureteral stone, UTI, diverticulitis, colitis, musculoskeletal pain.  We will obtain lab workup and CT abdomen/pelvis for further evaluation.  Patient's presentation is most consistent with acute complicated illness / injury requiring diagnostic workup.  ----------------------------------------- 4:24 PM on 10/18/2022 -----------------------------------------  Labs are unremarkable.  There is no leukocytosis.  LFTs and lipase are normal.  Urinalysis shows trace leukocyte Estrace but no bacteria or other acute findings.  CT shows evidence of diverticulitis.  There is no evidence of abscess or perforation.  At this time, the patient is appropriate for outpatient treatment given that she has relatively mild pain that is relieved with ibuprofen, is tolerating p.o., and has no fever or evidence of sepsis.  The patient feels comfortable and would like to go home.  I prescribed a course of Augmentin.  I gave her strict return precautions and she expresses understanding.   FINAL CLINICAL IMPRESSION(S) / ED DIAGNOSES   Final diagnoses:  Diverticulitis     Rx / DC Orders   ED Discharge Orders          Ordered    amoxicillin-clavulanate (AUGMENTIN) 875-125 MG tablet  2 times daily        10/18/22 1623             Note:  This document was prepared using Dragon voice recognition software and may include unintentional dictation errors.    Dionne Bucy, MD 10/18/22 1743

## 2022-10-18 NOTE — ED Notes (Signed)
Patient states pain is localized to LUQ and can be felt with light palpation. Patient states she took an Advil at 0900 today and has relief of the pain  unless palpated.

## 2022-10-18 NOTE — Discharge Instructions (Signed)
Take the antibiotic as prescribed and finish the full 1 week course.  You may continue to take ibuprofen as needed for pain.  Return to the ER for new, worsening, or persistent severe abdominal or flank pain, fever, vomiting, blood in the stool, weakness, or any other new or worsening symptoms that concern you.

## 2022-10-22 ENCOUNTER — Telehealth: Payer: Self-pay | Admitting: *Deleted

## 2022-10-22 NOTE — Transitions of Care (Post Inpatient/ED Visit) (Signed)
   10/22/2022  Name: ELEASE SWARM MRN: [id number] DOB: May 17, 1950  Today's TOC FU Call Status: Today's TOC FU Call Status:: Unsuccessul Call (1st Attempt) Unsuccessful Call (1st Attempt) Date: 10/22/22  Attempted to reach the patient regarding the most recent Inpatient/ED visit.  Follow Up Plan: Additional outreach attempts will be made to reach the patient to complete the Transitions of Care (Post Inpatient/ED visit) call.   Gean Maidens BSN RN [facility] [phone]

## 2022-10-26 DIAGNOSIS — K7689 Other specified diseases of liver: Secondary | ICD-10-CM | POA: Diagnosis not present

## 2022-10-26 DIAGNOSIS — R9389 Abnormal findings on diagnostic imaging of other specified body structures: Secondary | ICD-10-CM | POA: Diagnosis not present

## 2022-10-26 DIAGNOSIS — K429 Umbilical hernia without obstruction or gangrene: Secondary | ICD-10-CM | POA: Diagnosis not present

## 2022-10-26 DIAGNOSIS — C8581 Other specified types of non-Hodgkin lymphoma, lymph nodes of head, face, and neck: Secondary | ICD-10-CM | POA: Diagnosis not present

## 2022-10-26 DIAGNOSIS — K4020 Bilateral inguinal hernia, without obstruction or gangrene, not specified as recurrent: Secondary | ICD-10-CM | POA: Diagnosis not present

## 2022-10-26 DIAGNOSIS — K5730 Diverticulosis of large intestine without perforation or abscess without bleeding: Secondary | ICD-10-CM | POA: Diagnosis not present

## 2022-10-26 DIAGNOSIS — I700 Atherosclerosis of aorta: Secondary | ICD-10-CM | POA: Diagnosis not present

## 2022-10-28 ENCOUNTER — Telehealth: Payer: Self-pay | Admitting: *Deleted

## 2022-10-28 DIAGNOSIS — C8581 Other specified types of non-Hodgkin lymphoma, lymph nodes of head, face, and neck: Secondary | ICD-10-CM | POA: Diagnosis not present

## 2022-10-28 NOTE — Transitions of Care (Post Inpatient/ED Visit) (Signed)
   10/28/2022  Name: Julie Pena MRN: [id number] DOB: [birth year]  Today's TOC FU Call Status: Today's TOC FU Call Status:: Unsuccessful Call (2nd Attempt) Unsuccessful Call (2nd Attempt) Date: 10/28/22  Attempted to reach the patient regarding the most recent Inpatient/ED visit.  Follow Up Plan: Additional outreach attempts will be made to reach the patient to complete the Transitions of Care (Post Inpatient/ED visit) call.   Gean Maidens BSN RN [facility] [phone]

## 2022-10-30 ENCOUNTER — Telehealth: Payer: Self-pay | Admitting: *Deleted

## 2022-10-30 NOTE — Transitions of Care (Post Inpatient/ED Visit) (Signed)
   10/30/2022  Name: Julie Pena MRN: [id number] DOB: 06/21/1950  Today's TOC FU Call Status: Today's TOC FU Call Status:: Unsuccessful Call (3rd Attempt) Unsuccessful Call (3rd Attempt) Date: 10/30/22  Attempted to reach the patient regarding the most recent Inpatient/ED visit.  Follow Up Plan: No further outreach attempts will be made at this time. We have been unable to contact the patient.  Gean Maidens BSN RN [facility] [phone]

## 2022-11-02 DIAGNOSIS — C8581 Other specified types of non-Hodgkin lymphoma, lymph nodes of head, face, and neck: Secondary | ICD-10-CM | POA: Diagnosis not present

## 2022-11-20 DIAGNOSIS — C8581 Other specified types of non-Hodgkin lymphoma, lymph nodes of head, face, and neck: Secondary | ICD-10-CM | POA: Diagnosis not present

## 2022-11-28 ENCOUNTER — Encounter: Payer: Self-pay | Admitting: Family Medicine

## 2022-11-30 NOTE — Telephone Encounter (Signed)
Please review.  KP

## 2022-12-01 NOTE — Telephone Encounter (Signed)
Please review.  KP

## 2022-12-04 DIAGNOSIS — C8581 Other specified types of non-Hodgkin lymphoma, lymph nodes of head, face, and neck: Secondary | ICD-10-CM | POA: Diagnosis not present

## 2022-12-04 DIAGNOSIS — C6901 Malignant neoplasm of right conjunctiva: Secondary | ICD-10-CM | POA: Diagnosis not present

## 2022-12-16 DIAGNOSIS — C8581 Other specified types of non-Hodgkin lymphoma, lymph nodes of head, face, and neck: Secondary | ICD-10-CM | POA: Diagnosis not present

## 2022-12-16 DIAGNOSIS — C6901 Malignant neoplasm of right conjunctiva: Secondary | ICD-10-CM | POA: Diagnosis not present

## 2022-12-22 DIAGNOSIS — C8581 Other specified types of non-Hodgkin lymphoma, lymph nodes of head, face, and neck: Secondary | ICD-10-CM | POA: Diagnosis not present

## 2022-12-23 DIAGNOSIS — C8581 Other specified types of non-Hodgkin lymphoma, lymph nodes of head, face, and neck: Secondary | ICD-10-CM | POA: Diagnosis not present

## 2022-12-23 DIAGNOSIS — C6901 Malignant neoplasm of right conjunctiva: Secondary | ICD-10-CM | POA: Diagnosis not present

## 2023-01-18 ENCOUNTER — Ambulatory Visit (INDEPENDENT_AMBULATORY_CARE_PROVIDER_SITE_OTHER): Payer: Medicare HMO | Admitting: Family Medicine

## 2023-01-18 VITALS — BP 134/74 | HR 91 | Ht 66.0 in | Wt 177.0 lb

## 2023-01-18 DIAGNOSIS — H6121 Impacted cerumen, right ear: Secondary | ICD-10-CM | POA: Diagnosis not present

## 2023-01-18 NOTE — Progress Notes (Signed)
Subjective:    Patient ID: Julie Pena, female    DOB: 1950-09-18, 72 y.o.   MRN: [id number]  Julie Pena is a 72 y.o. female presenting on 01/18/2023 for Ear Fullness (R ear worse than left/)   HPI  Discussed the use of AI scribe software for clinical note transcription with the patient, who gave verbal consent to proceed.     The patient, with a history of ear wax impaction, presents with complaints of decreased hearing and a sensation of fullness in the ear. They report that the symptoms are worse in the morning. The patient has a history of flying with excess ear wax, which caused complications, and is due to fly again in the coming week.  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 They are four weeks into the treatment and report that the swelling, which was initially significant, has reduced considerably. The patient is due for an MRI in November  The patient also mentions a diagnosis of neuropathy in their feet, for which they have been using a red light therapy device with significant improvement. They have not reported any new symptoms or changes in their overall health status.          01/18/2023    1:20 PM 01/23/2022   11:49 AM 01/22/2021    1:38 PM  Depression screen PHQ 2/9  Decreased Interest 0 0 0  Down, Depressed, Hopeless 0 0 0  PHQ - 2 Score 0 0 0  Altered sleeping  0 0  Tired, decreased energy  0 3  Change in appetite  0 0  Feeling bad or failure about yourself   0 0  Trouble concentrating  0 0  Moving slowly or fidgety/restless  0 0  Suicidal thoughts  0 0  PHQ-9 Score  0 3  Difficult doing work/chores  Not difficult at all Not difficult at all    Social History   Tobacco Use   Smoking status: Every Day    Current packs/day: 0.50    Average packs/day: 0.5 packs/day for 47.0 years (23.5 ttl pk-yrs)     Types: Cigarettes   Smokeless tobacco: Current   Tobacco comments:    enrolled in smoking cessation class starting 08/2019  Vaping Use   Vaping status: Never Used  Substance Use Topics   Alcohol use: Never   Drug use: Never    Review of Systems Per HPI unless specifically indicated above     Objective:    BP 134/74 (BP Location: Left Arm, Cuff Size: Normal)   Pulse 91   Ht 5\' 6"  (1.676 m)   Wt 177 lb (80.3 kg)   SpO2 95%   BMI 28.57 kg/m   Wt Readings from Last 3 Encounters:  01/18/23 177 lb (80.3 kg)  10/18/22 160 lb (72.6 kg)  02/23/22 176 lb (79.8 kg)    Physical Exam Vitals and nursing note reviewed.  Constitutional:      General: She is not in acute distress.    Appearance: Normal appearance. She is well-developed. She is not diaphoretic.     Comments: Well-appearing, comfortable, cooperative  HENT:     Head: Normocephalic and atraumatic.     Right Ear: There is impacted cerumen.     Left Ear: There is no impacted cerumen.  Eyes:     General:  Right eye: No discharge.        Left eye: No discharge.     Conjunctiva/sclera: Conjunctivae normal.  Cardiovascular:     Rate and Rhythm: Normal rate.  Pulmonary:     Effort: Pulmonary effort is normal.  Skin:    General: Skin is warm and dry.     Findings: No erythema or rash.  Neurological:     Mental Status: She is alert and oriented to person, place, and time.  Psychiatric:        Mood and Affect: Mood normal.        Behavior: Behavior normal.        Thought Content: Thought content normal.     Comments: Well groomed, good eye contact, normal speech and thoughts     ________________________________________________________ PROCEDURE NOTE Date: 01/18/23 Right Ear Lavage / Cerumen Removal Discussed benefits and risks (including pain / discomforts, dizziness, minor abrasion of ear canal). Verbal consent given by patient. Medication:  carbamide peroxide ear drops, Ear Lavage Solution (warm water +  hydrogen peroxide) Performed by Dr Althea Charon Several drops of carbamide peroxide placed in RIGHT ear, allowed to sit for few minutes. Ear lavage solution flushed into one ear at a time in attempt to dislodge and remove ear wax. Results successful removal of cerumen R ear.  Repeat Ear Exam: - Completely removed cerumen now, with clear ear canals and visible TMs clear and normal appearance.    Results for orders placed or performed during the hospital encounter of 10/18/22  Lipase, blood  Result Value Ref Range   Lipase 29 11 - 51 U/L  Comprehensive metabolic panel  Result Value Ref Range   Sodium 138 135 - 145 mmol/L   Potassium 3.9 3.5 - 5.1 mmol/L   Chloride 107 98 - 111 mmol/L   CO2 24 22 - 32 mmol/L   Glucose, Bld 113 (H) 70 - 99 mg/dL   BUN 14 8 - 23 mg/dL   Creatinine, Ser 1.61 0.44 - 1.00 mg/dL   Calcium 8.9 8.9 - 09.6 mg/dL   Total Protein 6.9 6.5 - 8.1 g/dL   Albumin 4.3 3.5 - 5.0 g/dL   AST 16 15 - 41 U/L   ALT 16 0 - 44 U/L   Alkaline Phosphatase 75 38 - 126 U/L   Total Bilirubin 0.9 0.3 - 1.2 mg/dL   GFR, Estimated >04 >54 mL/min   Anion gap 7 5 - 15  CBC  Result Value Ref Range   WBC 8.7 4.0 - 10.5 K/uL   RBC 4.03 3.87 - 5.11 MIL/uL   Hemoglobin 12.4 12.0 - 15.0 g/dL   HCT 09.8 11.9 - 14.7 %   MCV 90.1 80.0 - 100.0 fL   MCH 30.8 26.0 - 34.0 pg   MCHC 34.2 30.0 - 36.0 g/dL   RDW 82.9 56.2 - 13.0 %   Platelets 156 150 - 400 K/uL   nRBC 0.0 0.0 - 0.2 %  Urinalysis, Routine w reflex microscopic -Urine, Clean Catch  Result Value Ref Range   Color, Urine YELLOW (A) YELLOW   APPearance CLEAR (A) CLEAR   Specific Gravity, Urine 1.010 1.005 - 1.030   pH 5.0 5.0 - 8.0   Glucose, UA NEGATIVE NEGATIVE mg/dL   Hgb urine dipstick SMALL (A) NEGATIVE   Bilirubin Urine NEGATIVE NEGATIVE   Ketones, ur NEGATIVE NEGATIVE mg/dL   Protein, ur NEGATIVE NEGATIVE mg/dL   Nitrite NEGATIVE NEGATIVE   Leukocytes,Ua TRACE (A) NEGATIVE   RBC / HPF 0-5  0 - 5 RBC/hpf   WBC, UA  0-5 0 - 5 WBC/hpf   Bacteria, UA NONE SEEN NONE SEEN   Squamous Epithelial / HPF 6-10 0 - 5 /HPF   Mucus PRESENT       Assessment & Plan:   Problem List Items Addressed This Visit   None Visit Diagnoses     Hearing loss of right ear due to cerumen impaction    -  Primary       Assessment and Plan    Cerumen Impaction Significant cerumen impaction in both ears, more severe in the right ear, causing hearing impairment. No associated pain or pressure. -Performed RIGHT ear irrigation to remove cerumen. -Advised patient to return if symptoms persist or worsen.  Radiation Oncology Follow-up Patient is four weeks post-radiation treatment. Swelling has significantly reduced and patient reports improvement. -Continue current management plan. -Scheduled MRI in November to assess treatment response.  General Health Maintenance -Advised to get flu shot at [facility] due to insurance coverage.      No orders of the defined types were placed in this encounter.     Follow up plan: Return if symptoms worsen or fail to improve.  Saralyn Pilar, DO [facility] 01/18/2023, 10:47 AM

## 2023-01-18 NOTE — Patient Instructions (Addendum)
Thank you for coming to the office today.    Please schedule a Follow-up Appointment to: Return if symptoms worsen or fail to improve.  If you have any other questions or concerns, please feel free to call the office or send a message through MyChart. You may also schedule an earlier appointment if necessary.  Additionally, you may be receiving a survey about your experience at our office within a few days to 1 week by e-mail or mail. We value your feedback.  Saralyn Pilar, DO [facility], [place]

## 2023-01-29 ENCOUNTER — Ambulatory Visit (INDEPENDENT_AMBULATORY_CARE_PROVIDER_SITE_OTHER): Payer: Medicare HMO

## 2023-01-29 DIAGNOSIS — Z0000 Encounter for general adult medical examination without abnormal findings: Secondary | ICD-10-CM

## 2023-01-29 NOTE — Patient Instructions (Addendum)
Julie Pena , Thank you for taking time to come for your Medicare Wellness Visit. I appreciate your ongoing commitment to your health goals. Please review the following plan we discussed and let me know if I can assist you in the future.   Referrals/Orders/Follow-Ups/Clinician Recommendations: none  This is a list of the screening recommended for you and due dates:  Health Maintenance  Topic Date Due   COVID-19 Vaccine (1) Never done   Zoster (Shingles) Vaccine (1 of 2) Never done   Pneumonia Vaccine (2 of 2 - PCV) 07/31/2021   Mammogram  08/28/2022   Screening for Lung Cancer  09/23/2022   Flu Shot  11/05/2022   Colon Cancer Screening  09/19/2023   Medicare Annual Wellness Visit  01/29/2024   DTaP/Tdap/Td vaccine (2 - Td or Tdap) 08/26/2027   DEXA scan (bone density measurement)  Completed   Hepatitis C Screening  Completed   HPV Vaccine  Aged Out   Cologuard (Stool DNA test)  Discontinued    Advanced directives: (ACP Link)Information on Advanced Care Planning can be found at [facility] [place] Advance Health Care Directives Advance Health Care Directives ([email or web address]   Next Medicare Annual Wellness Visit scheduled for next year: Yes   02/04/24 @ 1:00 pm by video

## 2023-01-29 NOTE — Progress Notes (Signed)
Subjective:   Julie Pena is a 72 y.o. female who presents for Medicare Annual (Subsequent) preventive examination.  Visit Complete: Virtual I connected with  [name] on 01/29/23 by a audio enabled telemedicine application and verified that I am speaking with the correct person using two identifiers.  Patient Location: Home  Provider Location: Office/Clinic  I discussed the limitations of evaluation and management by telemedicine. The patient expressed understanding and agreed to proceed.  Vital Signs: Because this visit was a virtual/telehealth visit, some criteria may be missing or patient reported. Any vitals not documented were not able to be obtained and vitals that have been documented are patient reported.   Cardiac Risk Factors include: advanced age (>98men, >47 women);dyslipidemia;smoking/ tobacco exposure     Objective:    There were no vitals filed for this visit. There is no height or weight on file to calculate BMI.     01/29/2023   11:03 AM 10/18/2022    1:16 PM 01/23/2022   11:50 AM 01/14/2021    2:11 PM 09/18/2020    7:40 AM 07/11/2019    3:30 PM  Advanced Directives  Does Patient Have a Medical Advance Directive? No No No Yes Yes Yes  Type of Theme park manager;Living will Healthcare Power of [place];Living will Living will;Healthcare Power of Attorney  Copy of Healthcare Power of Attorney in Chart?    No - copy requested No - copy requested No - copy requested  Would patient like information on creating a medical advance directive? No - Patient declined No - Patient declined No - Patient declined       Current Medications (verified) Outpatient Encounter Medications as of 01/29/2023  Medication Sig   Probiotic Product (ALIGN PO) Take by mouth. Take probiotic daily   No facility-administered encounter medications on file as of 01/29/2023.    Allergies (verified) Codeine   History: Past Medical History:   Diagnosis Date   Diverticulosis    Past Surgical History:  Procedure Laterality Date   ABDOMINAL HYSTERECTOMY  1988   Initial partial 1978, then repeat TOTAL 1988   BREAST EXCISIONAL BIOPSY Right mid 1980s   benign   CATARACT EXTRACTION Bilateral 2018   COLONOSCOPY WITH PROPOFOL N/A 09/18/2020   Procedure: COLONOSCOPY WITH PROPOFOL;  Surgeon: Toney Reil, MD;  Location: [facility] ENDOSCOPY;  Service: Gastroenterology;  Laterality: N/A;   DENTAL SURGERY  04/2019   OOPHORECTOMY     TONSILLECTOMY Bilateral 1963   Family History  Problem Relation Age of Onset   Cancer Mother        kidney   Kidney cancer Mother    Cancer Father        lung   Alcohol abuse Father    Depression Father    Lung cancer Father    Colon cancer Neg Hx    Breast cancer Neg Hx    Social History   Socioeconomic History   Marital status: Married    Spouse name: Gioia Lawal   Number of children: Not on file   Years of education: Not on file   Highest education level: 12th grade  Occupational History   Occupation: retired  Tobacco Use   Smoking status: Every Day    Current packs/day: 0.50    Average packs/day: 0.5 packs/day for 47.0 years (23.5 ttl pk-yrs)    Types: Cigarettes   Smokeless tobacco: Current   Tobacco comments:    enrolled in smoking cessation class starting  08/2019  Vaping Use   Vaping status: Never Used  Substance and Sexual Activity   Alcohol use: Never   Drug use: Never   Sexual activity: Yes  Other Topics Concern   Not on file  Social History Narrative   Not on file   Social Determinants of Health   Financial Resource Strain: Low Risk  (01/29/2023)   Overall Financial Resource Strain (CARDIA)    Difficulty of Paying Living Expenses: Not hard at all  Food Insecurity: No Food Insecurity (01/29/2023)   Hunger Vital Sign    Worried About Running Out of Food in the Last Year: Never true    Ran Out of Food in the Last Year: Never true  Transportation Needs: No  Transportation Needs (01/29/2023)   PRAPARE - Administrator, Civil Service (Medical): No    Lack of Transportation (Non-Medical): No  Physical Activity: Sufficiently Active (01/29/2023)   Exercise Vital Sign    Days of Exercise per Week: 7 days    Minutes of Exercise per Session: 30 min  Recent Concern: Physical Activity - Insufficiently Active (01/18/2023)   Exercise Vital Sign    Days of Exercise per Week: 3 days    Minutes of Exercise per Session: 30 min  Stress: No Stress Concern Present (01/29/2023)   [employer] of Occupational Health - Occupational Stress Questionnaire    Feeling of Stress : Not at all  Social Connections: Socially Integrated (01/29/2023)   Social Connection and Isolation Panel [NHANES]    Frequency of Communication with Friends and Family: More than three times a week    Frequency of Social Gatherings with Friends and Family: More than three times a week    Attends Religious Services: More than 4 times per year    Active Member of [employer] or Organizations: Yes    Attends Engineer, structural: More than 4 times per year    Marital Status: Married    Tobacco Counseling Ready to quit: Not Answered Counseling given: Not Answered Tobacco comments: enrolled in smoking cessation class starting 08/2019   Clinical Intake:  Pre-visit preparation completed: Yes  Pain : No/denies pain     Nutritional Status: BMI 25 -29 Overweight Nutritional Risks: None Diabetes: No  How often do you need to have someone help you when you read instructions, pamphlets, or other written materials from your doctor or pharmacy?: 1 - Never  Interpreter Needed?: No  Information entered by :: Kennedy Bucker, LPN   Activities of Daily Living    01/29/2023   11:04 AM  In your present state of health, do you have any difficulty performing the following activities:  Hearing? 0  Vision? 0  Difficulty concentrating or making decisions? 0  Walking or  climbing stairs? 0  Dressing or bathing? 0  Doing errands, shopping? 0  Preparing Food and eating ? N  Using the Toilet? N  In the past six months, have you accidently leaked urine? N  Do you have problems with loss of bowel control? N  Managing your Medications? N  Managing your Finances? N  Housekeeping or managing your Housekeeping? N    Patient Care Team: Smitty Cords, DO as PCP - General (Family Medicine) Isla Pence, OD (Optometry)  Indicate any recent Medical Services you may have received from other than [facility] providers in the past year (date may be approximate).     Assessment:   This is a routine wellness examination for [place].  Hearing/Vision screen Hearing  Screening - Comments:: No aids Vision Screening - Comments:: Readers- Dr. Clydene Pugh   Goals Addressed             This Visit's Progress    DIET - INCREASE WATER INTAKE         Depression Screen    01/29/2023   11:01 AM 01/18/2023    1:20 PM 01/23/2022   11:49 AM 01/22/2021    1:38 PM 01/14/2021    2:12 PM 07/11/2019    3:25 PM 11/10/2018    8:59 AM  PHQ 2/9 Scores  PHQ - 2 Score 0 0 0 0 0 0 0  PHQ- 9 Score 0  0 3       Fall Risk    01/29/2023   11:04 AM 01/23/2022   11:51 AM 01/22/2021    1:38 PM 01/14/2021    2:12 PM 07/11/2019    3:24 PM  Fall Risk   Falls in the past year? 0 0 0 0 0  Number falls in past yr: 0 0 0  0  Injury with Fall? 0 0 0  0  Risk for fall due to : No Fall Risks No Fall Risks  No Fall Risks   Follow up Falls prevention discussed;Falls evaluation completed Falls prevention discussed;Falls evaluation completed Falls evaluation completed Falls evaluation completed;Education provided;Falls prevention discussed     MEDICARE RISK AT HOME: Medicare Risk at Home Any stairs in or around the home?: Yes If so, are there any without handrails?: No Home free of loose throw rugs in walkways, pet beds, electrical cords, etc?: Yes Adequate lighting in your home to  reduce risk of falls?: Yes Life alert?: No Use of a cane, walker or w/c?: No Grab bars in the bathroom?: No Shower chair or bench in shower?: No Elevated toilet seat or a handicapped toilet?: Yes  TIMED UP AND GO:  Was the test performed?  No    Cognitive Function:        01/29/2023   11:06 AM 01/23/2022   11:51 AM 01/14/2021    2:13 PM  6CIT Screen  What Year? 0 points 0 points 0 points  What month? 0 points 0 points 0 points  What time? 0 points 0 points 0 points  Count back from 20 0 points 0 points 0 points  Months in reverse 0 points 0 points 0 points  Repeat phrase 0 points 0 points 0 points  Total Score 0 points 0 points 0 points    Immunizations Immunization History  Administered Date(s) Administered   Fluad Quad(high Dose 65+) 01/19/2019   Influenza, High Dose Seasonal PF 01/22/2017   Influenza-Unspecified 02/05/2018, 01/10/2020   Pneumococcal Polysaccharide-23 07/31/2020   Tdap 08/25/2017    TDAP status: Up to date  Flu Vaccine status: Declined, Education has been provided regarding the importance of this vaccine but patient still declined. Advised may receive this vaccine at local pharmacy or Health Dept. Aware to provide a copy of the vaccination record if obtained from local pharmacy or Health Dept. Verbalized acceptance and understanding.  Pneumococcal vaccine status: Up to date  Covid-19 vaccine status: Declined, Education has been provided regarding the importance of this vaccine but patient still declined. Advised may receive this vaccine at local pharmacy or Health Dept.or vaccine clinic. Aware to provide a copy of the vaccination record if obtained from local pharmacy or Health Dept. Verbalized acceptance and understanding.  Qualifies for Shingles Vaccine? Yes   Zostavax completed No   Shingrix Completed?: No.  Education has been provided regarding the importance of this vaccine. Patient has been advised to call insurance company to determine out  of pocket expense if they have not yet received this vaccine. Advised may also receive vaccine at local pharmacy or Health Dept. Verbalized acceptance and understanding.  Screening Tests Health Maintenance  Topic Date Due   COVID-19 Vaccine (1) Never done   Zoster Vaccines- Shingrix (1 of 2) Never done   Pneumonia Vaccine 78+ Years old (2 of 2 - PCV) 07/31/2021   MAMMOGRAM  08/28/2022   Lung Cancer Screening  09/23/2022   INFLUENZA VACCINE  11/05/2022   Colonoscopy  09/19/2023   Medicare Annual Wellness (AWV)  01/29/2024   DTaP/Tdap/Td (2 - Td or Tdap) 08/26/2027   DEXA SCAN  Completed   Hepatitis C Screening  Completed   HPV VACCINES  Aged Out   Fecal DNA (Cologuard)  Discontinued    Health Maintenance  Health Maintenance Due  Topic Date Due   COVID-19 Vaccine (1) Never done   Zoster Vaccines- Shingrix (1 of 2) Never done   Pneumonia Vaccine 34+ Years old (2 of 2 - PCV) 07/31/2021   MAMMOGRAM  08/28/2022   Lung Cancer Screening  09/23/2022   INFLUENZA VACCINE  11/05/2022    Colorectal cancer screening: Type of screening: Colonoscopy. Completed 09/18/20. Repeat every 3 years  Mammogram status: Completed 08/27/21. Repeat every year- declined referral  Bone Density status: Completed 08/01/20. Results reflect: Bone density results: NORMAL. Repeat every 5 years.  Lung Cancer Screening: (Low Dose CT Chest recommended if Age 72-80 years, 20 pack-year currently smoking OR have quit w/in 15years.) does qualify.   Lung Cancer Screening Referral: Had PET scan this year  Additional Screening:  Hepatitis C Screening: does qualify; Completed 08/18/17  Vision Screening: Recommended annual ophthalmology exams for early detection of glaucoma and other disorders of the eye. Is the patient up to date with their annual eye exam?  Yes  Who is the provider or what is the name of the office in which the patient attends annual eye exams? Dr.Woodard If pt is not established with a provider,  would they like to be referred to a provider to establish care? No .   Dental Screening: Recommended annual dental exams for proper oral hygiene   Community Resource Referral / Chronic Care Management: CRR required this visit?  No   CCM required this visit?  No     Plan:     I have personally reviewed and noted the following in the patient's chart:   Medical and social history Use of alcohol, tobacco or illicit drugs  Current medications and supplements including opioid prescriptions. Patient is not currently taking opioid prescriptions. Functional ability and status Nutritional status Physical activity Advanced directives List of other physicians Hospitalizations, surgeries, and ER visits in previous 12 months Vitals Screenings to include cognitive, depression, and falls Referrals and appointments  In addition, I have reviewed and discussed with patient certain preventive protocols, quality metrics, and best practice recommendations. A written personalized care plan for preventive services as well as general preventive health recommendations were provided to patient.     Hal Hope, LPN   [id number]   After Visit Summary: (MyChart) Due to this being a telephonic visit, the after visit summary with patients personalized plan was offered to patient via MyChart   Nurse Notes: none

## 2023-03-01 DIAGNOSIS — M47816 Spondylosis without myelopathy or radiculopathy, lumbar region: Secondary | ICD-10-CM | POA: Diagnosis not present

## 2023-03-01 DIAGNOSIS — M4316 Spondylolisthesis, lumbar region: Secondary | ICD-10-CM | POA: Diagnosis not present

## 2023-03-01 DIAGNOSIS — M13851 Other specified arthritis, right hip: Secondary | ICD-10-CM | POA: Diagnosis not present

## 2023-03-01 DIAGNOSIS — M533 Sacrococcygeal disorders, not elsewhere classified: Secondary | ICD-10-CM | POA: Diagnosis not present

## 2023-03-25 DIAGNOSIS — C6961 Malignant neoplasm of right orbit: Secondary | ICD-10-CM | POA: Diagnosis not present

## 2023-03-25 DIAGNOSIS — C6901 Malignant neoplasm of right conjunctiva: Secondary | ICD-10-CM | POA: Diagnosis not present

## 2023-04-01 DIAGNOSIS — C8581 Other specified types of non-Hodgkin lymphoma, lymph nodes of head, face, and neck: Secondary | ICD-10-CM | POA: Diagnosis not present

## 2023-04-02 DIAGNOSIS — C6901 Malignant neoplasm of right conjunctiva: Secondary | ICD-10-CM | POA: Diagnosis not present

## 2023-04-02 DIAGNOSIS — C8581 Other specified types of non-Hodgkin lymphoma, lymph nodes of head, face, and neck: Secondary | ICD-10-CM | POA: Diagnosis not present

## 2023-04-16 DIAGNOSIS — C8581 Other specified types of non-Hodgkin lymphoma, lymph nodes of head, face, and neck: Secondary | ICD-10-CM | POA: Diagnosis not present

## 2023-04-20 ENCOUNTER — Other Ambulatory Visit (HOSPITAL_COMMUNITY): Payer: Self-pay

## 2023-06-01 ENCOUNTER — Encounter: Payer: Self-pay | Admitting: Family Medicine

## 2023-06-01 ENCOUNTER — Ambulatory Visit (INDEPENDENT_AMBULATORY_CARE_PROVIDER_SITE_OTHER): Payer: HMO | Admitting: Family Medicine

## 2023-06-01 ENCOUNTER — Other Ambulatory Visit (HOSPITAL_COMMUNITY): Payer: Self-pay

## 2023-06-01 ENCOUNTER — Other Ambulatory Visit: Payer: Self-pay

## 2023-06-01 VITALS — BP 138/68 | HR 78 | Ht 66.0 in | Wt 171.0 lb

## 2023-06-01 DIAGNOSIS — R109 Unspecified abdominal pain: Secondary | ICD-10-CM | POA: Diagnosis not present

## 2023-06-01 DIAGNOSIS — R140 Abdominal distension (gaseous): Secondary | ICD-10-CM

## 2023-06-01 DIAGNOSIS — K219 Gastro-esophageal reflux disease without esophagitis: Secondary | ICD-10-CM

## 2023-06-01 DIAGNOSIS — R300 Dysuria: Secondary | ICD-10-CM

## 2023-06-01 LAB — POCT URINALYSIS DIPSTICK
Blood, UA: NEGATIVE
Glucose, UA: NEGATIVE
Ketones, UA: NEGATIVE
Nitrite, UA: NEGATIVE
Odor: POSITIVE
Protein, UA: NEGATIVE
Spec Grav, UA: 1.015 (ref 1.010–1.025)
Urobilinogen, UA: 0.2 U/dL
pH, UA: 5 (ref 5.0–8.0)

## 2023-06-01 MED ORDER — OMEPRAZOLE 40 MG PO CPDR: 40.0000 mg | DELAYED_RELEASE_CAPSULE | Freq: Every day | ORAL | 2 refills | Status: DC

## 2023-06-01 MED ORDER — OMEPRAZOLE 40 MG PO CPDR
40.0000 mg | DELAYED_RELEASE_CAPSULE | Freq: Every day | ORAL | 1 refills | Status: DC
Start: 2023-06-01 — End: 2023-09-01
  Filled 2023-06-01: qty 100, 100d supply, fill #0

## 2023-06-01 NOTE — Patient Instructions (Addendum)
 Thank you for coming to the office today.  For upper digestive symptoms  Start with Omeprazole 40mg  daily before breakfast empty stomach for 4-6 weeks let me know if need to continue or if prefer to phase off.  DIET RECOMMENDATIONS - Avoid spicy, greasy, fried foods, also things like caffeine, dark chocolate, peppermint can worsen - Avoid large meals and late night snacks, also do not go more than 4-5 hours without a snack or meal (not eating will worsen reflux symptoms due to stomach acid) - You may also elevate the head of your bed at night to sleep at very slight incline to help reduce symptoms   If the problem improves but keeps coming back, we can discuss higher dose or longer course at next visit.   If symptoms are worsening or persistent despite treatment or develop any different severe esophagus or abdominal pain, unable to swallow solids or liquids, nausea, vomiting especially blood in vomit, fever/chills, or unintentional weight loss / no appetite, please follow-up sooner in office or seek more immediate medical attention at hospital Emergency Department.  Regarding other medicines:   - STOP taking Ibuprofen, Advil, Motrin, Goody's / BC powder - DO NOT take without discussing with your doctor. These medicines can put you at high risk for future bleeding.  If need pain medicine, may take Tylenol Extra Strength (Acetaminophen) 500mg  tabs - take 1 to 2 tabs per dose (max 1000mg ) every 6-8 hours for pain (take regularly, don't skip a dose for next 7 days), max 24 hour daily dose is 6 tablets or 3000mg . In the future you can repeat the same everyday Tylenol course for 1-2 weeks at a time.    For lower digestive symptoms  Recommend IBGard OTC Peppermint Oil (Triple Coated Capsule) 180mg  take one 3 times daily to reduce diarrhea    Please schedule a Follow-up Appointment to: Return if symptoms worsen or fail to improve.  If you have any other questions or concerns, please feel free  to call the office or send a message through MyChart. You may also schedule an earlier appointment if necessary.  Additionally, you may be receiving a survey about your experience at our office within a few days to 1 week by e-mail or mail. We value your feedback.  Saralyn Pilar, DO [facility], [place]

## 2023-06-01 NOTE — Progress Notes (Signed)
 Subjective:    Patient ID: Julie Pena, female    DOB: May 23, 1950, 73 y.o.   MRN: [id number]  Julie Pena is a 73 y.o. female presenting on 06/01/2023 for Gastroesophageal Reflux   HPI  Discussed the use of AI scribe software for clinical note transcription with the patient, who gave verbal consent to proceed.  Patient presents for a same day appointment.   History of Present Illness   Julie Pena is a 73 year old female with diverticulitis who presents with gastrointestinal symptoms and possible urinary tract infection.  She has been experiencing gastrointestinal symptoms over the past several days, characterized by episodes of diarrhea that began on Friday after consuming chili beans, which she typically avoids due to previous stomach issues. The symptoms have been intermittent, with periods of relief and recurrence, and are accompanied by significant gas and belching. She notes that the symptoms do not follow a specific pattern related to food intake. Additionally, she describes a sensation of gas causing discomfort in the upper gastric region, which she describes as 'gurgling' and 'bubbles'. Despite a history of diverticulitis, she states that the current symptoms are not typical of her usual episodes. She recalls a PET scan in the past, which did not indicate any heart-related issues, despite her concerns about the sensation in her chest. She has not had an upper scope in recent years.  In addition to gastrointestinal symptoms, she reports urinary symptoms, including a strong odor in her urine and difficulty with urination, sometimes experiencing a weak stream. She mentions a previous urine test showing trace leukocytes but no significant infection. She experiences a sensation of needing to urinate without strong urgency or pain.  She describes episodes of skin flushing and redness, particularly between her breasts and sometimes extending downwards, which she associates with  feeling warm but not feverish. These episodes are episodic and sometimes accompanied by itching. No significant fever is reported, despite occasional skin flushing and redness.  Her family history includes relatives with 'quirky stomachs', and she mentions that digestive issues run in her family.         01/29/2023   11:01 AM 01/18/2023    1:20 PM 01/23/2022   11:49 AM  Depression screen PHQ 2/9  Decreased Interest 0 0 0  Down, Depressed, Hopeless 0 0 0  PHQ - 2 Score 0 0 0  Altered sleeping 0  0  Tired, decreased energy 0  0  Change in appetite 0  0  Feeling bad or failure about yourself  0  0  Trouble concentrating 0  0  Moving slowly or fidgety/restless 0  0  Suicidal thoughts 0  0  PHQ-9 Score 0  0  Difficult doing work/chores Not difficult at all  Not difficult at all       01/22/2021    1:39 PM  GAD 7 : Generalized Anxiety Score  Nervous, Anxious, on Edge 0  Control/stop worrying 0  Worry too much - different things 0  Trouble relaxing 0  Restless 0  Easily annoyed or irritable 0  Afraid - awful might happen 0  Total GAD 7 Score 0  Anxiety Difficulty Not difficult at all    Social History   Tobacco Use   Smoking status: Every Day    Current packs/day: 0.50    Average packs/day: 0.5 packs/day for 47.0 years (23.5 ttl pk-yrs)    Types: Cigarettes   Smokeless tobacco: Current   Tobacco comments:    enrolled in  smoking cessation class starting 08/2019  Vaping Use   Vaping status: Never Used  Substance Use Topics   Alcohol use: Never   Drug use: Never    Review of Systems Per HPI unless specifically indicated above     Objective:    BP 138/68 (BP Location: Left Arm, Cuff Size: Normal)   Pulse 78   Ht 5\' 6"  (1.676 m)   Wt 171 lb (77.6 kg)   SpO2 98%   BMI 27.60 kg/m   Wt Readings from Last 3 Encounters:  06/01/23 171 lb (77.6 kg)  01/18/23 177 lb (80.3 kg)  10/18/22 160 lb (72.6 kg)    Physical Exam Vitals and nursing note reviewed.   Constitutional:      General: She is not in acute distress.    Appearance: Normal appearance. She is well-developed. She is not diaphoretic.     Comments: Well-appearing, comfortable, cooperative  HENT:     Head: Normocephalic and atraumatic.  Eyes:     General:        Right eye: No discharge.        Left eye: No discharge.     Conjunctiva/sclera: Conjunctivae normal.  Cardiovascular:     Rate and Rhythm: Normal rate and regular rhythm.     Pulses: Normal pulses.     Heart sounds: Normal heart sounds. No murmur heard. Pulmonary:     Effort: Pulmonary effort is normal.  Skin:    General: Skin is warm and dry.     Findings: No erythema or rash.  Neurological:     Mental Status: She is alert and oriented to person, place, and time.  Psychiatric:        Mood and Affect: Mood normal.        Behavior: Behavior normal.        Thought Content: Thought content normal.     Comments: Well groomed, good eye contact, normal speech and thoughts     Results for orders placed or performed in visit on 06/01/23  POCT Urinalysis Dipstick   Collection Time: 06/01/23 11:22 AM  Result Value Ref Range   Color, UA yellow    Clarity, UA clear    Glucose, UA Negative Negative   Bilirubin, UA small    Ketones, UA negative    Spec Grav, UA 1.015 1.010 - 1.025   Blood, UA negative    pH, UA 5.0 5.0 - 8.0   Protein, UA Negative Negative   Urobilinogen, UA 0.2 0.2 or 1.0 E.U./dL   Nitrite, UA negative    Leukocytes, UA Trace (A) Negative   Appearance     Odor positive       Assessment & Plan:   Problem List Items Addressed This Visit   None Visit Diagnoses       Dysuria    -  Primary   Relevant Orders   POCT Urinalysis Dipstick (Completed)   Urine Culture     Gastroesophageal reflux disease without esophagitis       Relevant Medications   omeprazole (PRILOSEC) 40 MG capsule     Abdominal bloating with cramps            Functional Digestive Symptoms Possible IBS Episodic upper  and lower gastrointestinal symptoms including gas, bloating, and diarrhea. No clear pattern or trigger identified. Family history of "quirky stomachs".  Will treat upper GI GERD symptoms -Start Omeprazole 40mg  daily for 4-6 weeks. -Try over-the-counter IBgard (peppermint oil) for lower abdominal symptoms. -Continue current probiotic regimen. -  Reevaluate symptoms after treatment course. Consider Dicyclomine or other therapy. Consider GI in future if indicated  Possible Urinary Tract Infection Reports of changes in urine odor and sensation of incomplete bladder emptying. Urinalysis showed trace leukocytes but no clear signs of infection. -Send urine for culture to confirm or rule out infection prior to considering oral antibiotic  Skin Flushing Reports of episodic skin flushing in the chest area. No rash or fever present. Likely related to body's response to warmth or discomfort. -No specific treatment plan discussed.     Orders Placed This Encounter  Procedures   Urine Culture   POCT Urinalysis Dipstick    Meds ordered this encounter  Medications   DISCONTD: omeprazole (PRILOSEC) 40 MG capsule    Sig: Take 1 capsule (40 mg total) by mouth daily before breakfast.    Dispense:  30 capsule    Refill:  2   omeprazole (PRILOSEC) 40 MG capsule    Sig: Take 1 capsule (40 mg total) by mouth daily before breakfast.    Dispense:  100 capsule    Refill:  1    Follow up plan: Return if symptoms worsen or fail to improve.   Julie Pilar, DO [facility] 06/01/2023, 11:37 AM

## 2023-06-02 LAB — URINE CULTURE
MICRO NUMBER:: 16127191
Result:: NO GROWTH
SPECIMEN QUALITY:: ADEQUATE

## 2023-06-03 ENCOUNTER — Encounter: Payer: Self-pay | Admitting: Family Medicine

## 2023-07-01 DIAGNOSIS — C6901 Malignant neoplasm of right conjunctiva: Secondary | ICD-10-CM | POA: Diagnosis not present

## 2023-07-02 DIAGNOSIS — C6901 Malignant neoplasm of right conjunctiva: Secondary | ICD-10-CM | POA: Diagnosis not present

## 2023-07-02 DIAGNOSIS — C8581 Other specified types of non-Hodgkin lymphoma, lymph nodes of head, face, and neck: Secondary | ICD-10-CM | POA: Diagnosis not present

## 2023-07-27 ENCOUNTER — Other Ambulatory Visit: Payer: Self-pay | Admitting: Emergency Medicine

## 2023-07-27 DIAGNOSIS — Z122 Encounter for screening for malignant neoplasm of respiratory organs: Secondary | ICD-10-CM

## 2023-07-27 DIAGNOSIS — F17210 Nicotine dependence, cigarettes, uncomplicated: Secondary | ICD-10-CM

## 2023-07-27 DIAGNOSIS — Z87891 Personal history of nicotine dependence: Secondary | ICD-10-CM

## 2023-09-01 ENCOUNTER — Telehealth: Payer: Self-pay

## 2023-09-01 ENCOUNTER — Other Ambulatory Visit: Payer: Self-pay

## 2023-09-01 ENCOUNTER — Other Ambulatory Visit (HOSPITAL_COMMUNITY): Payer: Self-pay

## 2023-09-01 DIAGNOSIS — K219 Gastro-esophageal reflux disease without esophagitis: Secondary | ICD-10-CM

## 2023-09-01 MED ORDER — OMEPRAZOLE 40 MG PO CPDR
40.0000 mg | DELAYED_RELEASE_CAPSULE | Freq: Every day | ORAL | 3 refills | Status: AC
  Filled 2023-09-01: qty 100, 100d supply, fill #0
  Filled 2023-12-10 (×2): qty 100, 100d supply, fill #1
  Filled 2024-02-07 – 2024-03-20 (×2): qty 100, 100d supply, fill #2

## 2023-09-01 NOTE — Telephone Encounter (Signed)
 Spoke with patient, notified that our records indicate she should have a refill at the pharmacy. If not she will notify us  back.

## 2023-09-01 NOTE — Telephone Encounter (Signed)
 Patient would like a refill on Omeprazole  40mg  to [facility]. Patient report that this medication has helped her issues.

## 2023-09-27 ENCOUNTER — Ambulatory Visit
Admission: RE | Admit: 2023-09-27 | Discharge: 2023-09-27 | Disposition: A | Discharge status: Home / Self Care | Source: Ambulatory Visit | Attending: Acute Care | Admitting: Acute Care

## 2023-09-27 DIAGNOSIS — Z122 Encounter for screening for malignant neoplasm of respiratory organs: Secondary | ICD-10-CM | POA: Diagnosis not present

## 2023-09-27 DIAGNOSIS — Z87891 Personal history of nicotine dependence: Secondary | ICD-10-CM | POA: Diagnosis not present

## 2023-09-27 DIAGNOSIS — F17210 Nicotine dependence, cigarettes, uncomplicated: Secondary | ICD-10-CM | POA: Diagnosis not present

## 2023-09-30 ENCOUNTER — Encounter: Payer: Self-pay | Admitting: Family Medicine

## 2023-10-01 DIAGNOSIS — N390 Urinary tract infection, site not specified: Secondary | ICD-10-CM | POA: Diagnosis not present

## 2023-10-01 DIAGNOSIS — R109 Unspecified abdominal pain: Secondary | ICD-10-CM | POA: Diagnosis not present

## 2023-10-01 DIAGNOSIS — Z1152 Encounter for screening for COVID-19: Secondary | ICD-10-CM | POA: Diagnosis not present

## 2023-10-12 ENCOUNTER — Other Ambulatory Visit: Payer: Self-pay

## 2023-10-12 DIAGNOSIS — F17210 Nicotine dependence, cigarettes, uncomplicated: Secondary | ICD-10-CM

## 2023-10-12 DIAGNOSIS — Z87891 Personal history of nicotine dependence: Secondary | ICD-10-CM

## 2023-10-12 DIAGNOSIS — Z122 Encounter for screening for malignant neoplasm of respiratory organs: Secondary | ICD-10-CM

## 2023-10-12 DIAGNOSIS — C6901 Malignant neoplasm of right conjunctiva: Secondary | ICD-10-CM | POA: Diagnosis not present

## 2023-10-14 ENCOUNTER — Encounter: Payer: Self-pay | Admitting: Family Medicine

## 2023-10-15 DIAGNOSIS — C8581 Other specified types of non-Hodgkin lymphoma, lymph nodes of head, face, and neck: Secondary | ICD-10-CM | POA: Diagnosis not present

## 2023-11-08 ENCOUNTER — Other Ambulatory Visit: Payer: Self-pay | Admitting: Family Medicine

## 2023-11-08 ENCOUNTER — Other Ambulatory Visit

## 2023-11-08 DIAGNOSIS — Z0000 Encounter for general adult medical examination without abnormal findings: Secondary | ICD-10-CM

## 2023-11-08 DIAGNOSIS — R7309 Other abnormal glucose: Secondary | ICD-10-CM

## 2023-11-08 DIAGNOSIS — E785 Hyperlipidemia, unspecified: Secondary | ICD-10-CM

## 2023-11-09 ENCOUNTER — Other Ambulatory Visit

## 2023-11-09 DIAGNOSIS — Z0000 Encounter for general adult medical examination without abnormal findings: Secondary | ICD-10-CM | POA: Diagnosis not present

## 2023-11-09 DIAGNOSIS — R7309 Other abnormal glucose: Secondary | ICD-10-CM | POA: Diagnosis not present

## 2023-11-09 DIAGNOSIS — E785 Hyperlipidemia, unspecified: Secondary | ICD-10-CM | POA: Diagnosis not present

## 2023-11-10 ENCOUNTER — Other Ambulatory Visit

## 2023-11-10 LAB — CBC WITH DIFFERENTIAL/PLATELET
Absolute Lymphocytes: 1190 {cells}/uL (ref 850–3900)
Absolute Monocytes: 317 {cells}/uL (ref 200–950)
Basophils Absolute: 19 {cells}/uL (ref 0–200)
Basophils Relative: 0.4 %
Eosinophils Absolute: 48 {cells}/uL (ref 15–500)
Eosinophils Relative: 1 %
HCT: 36 % (ref 35.0–45.0)
Hemoglobin: 12 g/dL (ref 11.7–15.5)
MCH: 30.5 pg (ref 27.0–33.0)
MCHC: 33.3 g/dL (ref 32.0–36.0)
MCV: 91.4 fL (ref 80.0–100.0)
MPV: 10 fL (ref 7.5–12.5)
Monocytes Relative: 6.6 %
Neutro Abs: 3226 {cells}/uL (ref 1500–7800)
Neutrophils Relative %: 67.2 %
Platelets: 167 Thousand/uL (ref 140–400)
RBC: 3.94 Million/uL (ref 3.80–5.10)
RDW: 14 % (ref 11.0–15.0)
Total Lymphocyte: 24.8 %
WBC: 4.8 Thousand/uL (ref 3.8–10.8)

## 2023-11-10 LAB — COMPREHENSIVE METABOLIC PANEL WITH GFR
AG Ratio: 1.8 (calc) (ref 1.0–2.5)
ALT: 11 U/L (ref 6–29)
AST: 12 U/L (ref 10–35)
Albumin: 4 g/dL (ref 3.6–5.1)
Alkaline phosphatase (APISO): 79 U/L (ref 37–153)
BUN: 14 mg/dL (ref 7–25)
CO2: 31 mmol/L (ref 20–32)
Calcium: 9.2 mg/dL (ref 8.6–10.4)
Chloride: 106 mmol/L (ref 98–110)
Creat: 0.73 mg/dL (ref 0.60–1.00)
Globulin: 2.2 g/dL (ref 1.9–3.7)
Glucose, Bld: 86 mg/dL (ref 65–99)
Potassium: 4.4 mmol/L (ref 3.5–5.3)
Sodium: 142 mmol/L (ref 135–146)
Total Bilirubin: 0.8 mg/dL (ref 0.2–1.2)
Total Protein: 6.2 g/dL (ref 6.1–8.1)
eGFR: 87 mL/min/1.73m2 (ref >=60)

## 2023-11-10 LAB — TSH: TSH: 1.9 m[IU]/L (ref 0.40–4.50)

## 2023-11-10 LAB — HEMOGLOBIN A1C
Hgb A1c MFr Bld: 5.3 % (ref <5.7)
Mean Plasma Glucose: 105 mg/dL
eAG (mmol/L): 5.8 mmol/L

## 2023-11-10 LAB — LIPID PANEL
Cholesterol: 169 mg/dL (ref <200)
HDL: 42 mg/dL — ABNORMAL LOW (ref >=50)
LDL Cholesterol (Calc): 103 mg/dL — ABNORMAL HIGH
Non-HDL Cholesterol (Calc): 127 mg/dL (ref <130)
Total CHOL/HDL Ratio: 4 (calc) (ref <5.0)
Triglycerides: 137 mg/dL (ref <150)

## 2023-11-15 ENCOUNTER — Other Ambulatory Visit (HOSPITAL_COMMUNITY): Payer: Self-pay

## 2023-11-15 ENCOUNTER — Ambulatory Visit (INDEPENDENT_AMBULATORY_CARE_PROVIDER_SITE_OTHER): Admitting: Family Medicine

## 2023-11-15 ENCOUNTER — Other Ambulatory Visit: Payer: Self-pay

## 2023-11-15 VITALS — BP 138/80 | HR 86 | Ht 66.0 in | Wt 176.4 lb

## 2023-11-15 DIAGNOSIS — K219 Gastro-esophageal reflux disease without esophagitis: Secondary | ICD-10-CM | POA: Diagnosis not present

## 2023-11-15 DIAGNOSIS — N3001 Acute cystitis with hematuria: Secondary | ICD-10-CM

## 2023-11-15 DIAGNOSIS — R7309 Other abnormal glucose: Secondary | ICD-10-CM | POA: Diagnosis not present

## 2023-11-15 DIAGNOSIS — Z0000 Encounter for general adult medical examination without abnormal findings: Secondary | ICD-10-CM

## 2023-11-15 DIAGNOSIS — K635 Polyp of colon: Secondary | ICD-10-CM | POA: Diagnosis not present

## 2023-11-15 DIAGNOSIS — E785 Hyperlipidemia, unspecified: Secondary | ICD-10-CM

## 2023-11-15 DIAGNOSIS — Z1231 Encounter for screening mammogram for malignant neoplasm of breast: Secondary | ICD-10-CM | POA: Diagnosis not present

## 2023-11-15 MED ORDER — AMOXICILLIN-POT CLAVULANATE 875-125 MG PO TABS
1.0000 | ORAL_TABLET | Freq: Two times a day (BID) | ORAL | 0 refills | Status: AC
Start: 2023-11-15 — End: 2023-11-23
  Filled 2023-11-15: qty 14, 7d supply, fill #0

## 2023-11-15 NOTE — Progress Notes (Signed)
 Subjective:    Patient ID: Julie Pena, female    DOB: 1950/05/18, 73 y.o.   MRN: [id number]  Julie Pena is a 73 y.o. female presenting on 11/15/2023 for Annual Exam   HPI  Discussed the use of AI scribe software for clinical note transcription with the patient, who gave verbal consent to proceed.  History of Present Illness   Julie Pena Splinter is a 73 year old female who presents for an annual physical exam.  Cancer surveillance and screening Followed by [facility] / [facility] On[facility] Right Eye Marginal Zone Lymphoma, and malignant neoplasm of R conunctiva Cleared at this time, has had prior scans, PET scan and now is on 4-6 months repeat MRI - History of malignant neoplasm of the right conjunctiva, treated with two radiation sessions - Recent MRI completed; follow-up with oncology scheduled in six months and then in one year - PET scan performed less than a year ago - Interested in scheduling a mammogram to maintain routine cancer screenings  Colorectal cancer screening - Last colonoscopy in 2022 revealed a couple of polyps - Previous gastroenterologist is no longer available; seeking a new provider for follow-up Needs referral  Hyperlipidemia - History of elevated cholesterol - Recent blood work shows improvement: total cholesterol 169, LDL 103 - Not currently on statin therapy Interested in CT Calcium  Score Screening  Post-covid fatigue - Episodic significant weakness and low energy - Symptoms vary day-to-day - Maintains good nutrition and sleep habits  Urinary tract symptoms - History of urinary tract infections - Recent symptoms include cloudy urine and strong odor - Previously treated with Macrobid , which was not fully effective recently   Preventive health maintenance - Due for annual vaccinations, including influenza and Prevnar 20 - Plans to receive vaccinations at the pharmacy when available      History of endometriosis / Postmenopausal Estrogen  Deficiency S/p partial hysterectomy and then 2nd complete Postmenopausal Estrogen Deficiency - off HRT  History of recurrent UTI vs Asymptomatic Bacteruria Chronic problem.         Health Maintenance:      Breast CA Screening:  Due for mammogram screening. no fam history. Ordered today. She will call to schedule.   Colon CA Screening:  Last Colonoscopy 09/18/2020 Dr Unk AGI with some polyps tubular adenoma, repeat in 3 years 09/2023 Currently asymptomatic. No known family history of colon CA. Prior negative Cologuards - See above needs referral       01/29/2023   11:01 AM 01/18/2023    1:20 PM 01/23/2022   11:49 AM  Depression screen PHQ 2/9  Decreased Interest 0 0 0  Down, Depressed, Hopeless 0 0 0  PHQ - 2 Score 0 0 0  Altered sleeping 0  0  Tired, decreased energy 0  0  Change in appetite 0  0  Feeling bad or failure about yourself  0  0  Trouble concentrating 0  0  Moving slowly or fidgety/restless 0  0  Suicidal thoughts 0  0  PHQ-9 Score 0  0  Difficult doing work/chores Not difficult at all  Not difficult at all       01/22/2021    1:39 PM  GAD 7 : Generalized Anxiety Score  Nervous, Anxious, on Edge 0  Control/stop worrying 0  Worry too much - different things 0  Trouble relaxing 0  Restless 0  Easily annoyed or irritable 0  Afraid - awful might happen 0  Total GAD 7 Score 0  Anxiety Difficulty Not difficult at all     Past Medical History:  Diagnosis Date   Diverticulosis    Past Surgical History:  Procedure Laterality Date   ABDOMINAL HYSTERECTOMY  1988   Initial partial 1978, then repeat TOTAL 1988   BREAST EXCISIONAL BIOPSY Right mid 1980s   benign   CATARACT EXTRACTION Bilateral 2018   COLONOSCOPY WITH PROPOFOL  N/A 09/18/2020   Procedure: COLONOSCOPY WITH PROPOFOL ;  Surgeon: Unk Corinn Skiff, MD;  Location: [facility] ENDOSCOPY;  Service: Gastroenterology;  Laterality: N/A;   DENTAL SURGERY  04/2019   OOPHORECTOMY     TONSILLECTOMY  Bilateral 1963   Social History   Socioeconomic History   Marital status: Married    Spouse name: Tannie Koskela   Number of children: Not on file   Years of education: Not on file   Highest education level: 12th grade  Occupational History   Occupation: retired  Tobacco Use   Smoking status: Every Day    Current packs/day: 0.50    Average packs/day: 0.5 packs/day for 47.0 years (23.5 ttl pk-yrs)    Types: Cigarettes   Smokeless tobacco: Current   Tobacco comments:    enrolled in smoking cessation class starting 08/2019  Vaping Use   Vaping status: Never Used  Substance and Sexual Activity   Alcohol use: Never   Drug use: Never   Sexual activity: Yes  Other Topics Concern   Not on file  Social History Narrative   Not on file   Social Drivers of Health   Financial Resource Strain: Low Risk  (11/15/2023)   Overall Financial Resource Strain (CARDIA)    Difficulty of Paying Living Expenses: Not hard at all  Food Insecurity: No Food Insecurity (11/15/2023)   Hunger Vital Sign    Worried About Running Out of Food in the Last Year: Never true    Ran Out of Food in the Last Year: Never true  Transportation Needs: No Transportation Needs (11/15/2023)   PRAPARE - Administrator, Civil Service (Medical): No    Lack of Transportation (Non-Medical): No  Physical Activity: Insufficiently Active (11/15/2023)   Exercise Vital Sign    Days of Exercise per Week: 2 days    Minutes of Exercise per Session: 10 min  Stress: No Stress Concern Present (11/15/2023)   [employer] of Occupational Health - Occupational Stress Questionnaire    Feeling of Stress: Not at all  Social Connections: Moderately Integrated (11/15/2023)   Social Connection and Isolation Panel    Frequency of Communication with Friends and Family: More than three times a week    Frequency of Social Gatherings with Friends and Family: Three times a week    Attends Religious Services: More than 4 times per  year    Active Member of Clubs or Organizations: No    Attends Banker Meetings: Not on file    Marital Status: Married  Intimate Partner Violence: Not At Risk (01/29/2023)   Humiliation, Afraid, Rape, and Kick questionnaire    Fear of Current or Ex-Partner: No    Emotionally Abused: No    Physically Abused: No    Sexually Abused: No   Family History  Problem Relation Age of Onset   Cancer Mother        kidney   Kidney cancer Mother    Cancer Father        lung   Alcohol abuse Father    Depression Father    Lung cancer Father  Colon cancer Neg Hx    Breast cancer Neg Hx    Current Outpatient Medications on File Prior to Visit  Medication Sig   omeprazole  (PRILOSEC) 40 MG capsule Take 1 capsule (40 mg total) by mouth daily before breakfast.   Probiotic Product (ALIGN PO) Take by mouth. Take probiotic daily   No current facility-administered medications on file prior to visit.    Review of Systems  Constitutional:  Negative for activity change, appetite change, chills, diaphoresis, fatigue and fever.  HENT:  Negative for congestion and hearing loss.   Eyes:  Negative for visual disturbance.  Respiratory:  Negative for cough, chest tightness, shortness of breath and wheezing.   Cardiovascular:  Negative for chest pain, palpitations and leg swelling.  Gastrointestinal:  Negative for abdominal pain, constipation, diarrhea, nausea and vomiting.  Genitourinary:  Negative for dysuria, frequency and hematuria.  Musculoskeletal:  Negative for arthralgias and neck pain.  Skin:  Negative for rash.  Neurological:  Negative for dizziness, weakness, light-headedness, numbness and headaches.  Hematological:  Negative for adenopathy.  Psychiatric/Behavioral:  Negative for behavioral problems, dysphoric mood and sleep disturbance.    Per HPI unless specifically indicated above     Objective:    BP 138/80 (BP Location: Left Arm, Cuff Size: Normal)   Pulse 86   Ht 5'  6 (1.676 m)   Wt 176 lb 6 oz (80 kg)   SpO2 95%   BMI 28.47 kg/m   Wt Readings from Last 3 Encounters:  11/15/23 176 lb 6 oz (80 kg)  06/01/23 171 lb (77.6 kg)  01/18/23 177 lb (80.3 kg)    Physical Exam Vitals and nursing note reviewed.  Constitutional:      General: She is not in acute distress.    Appearance: She is well-developed. She is not diaphoretic.     Comments: Well-appearing, comfortable, cooperative  HENT:     Head: Normocephalic and atraumatic.  Eyes:     General:        Right eye: No discharge.        Left eye: No discharge.     Conjunctiva/sclera: Conjunctivae normal.     Pupils: Pupils are equal, round, and reactive to light.  Neck:     Thyroid: No thyromegaly.  Cardiovascular:     Rate and Rhythm: Normal rate and regular rhythm.     Pulses: Normal pulses.     Heart sounds: Normal heart sounds. No murmur heard. Pulmonary:     Effort: Pulmonary effort is normal. No respiratory distress.     Breath sounds: Normal breath sounds. No wheezing or rales.  Abdominal:     General: Bowel sounds are normal. There is no distension.     Palpations: Abdomen is soft. There is no mass.     Tenderness: There is no abdominal tenderness.  Musculoskeletal:        General: No tenderness. Normal range of motion.     Cervical back: Normal range of motion and neck supple.     Comments: Upper / Lower Extremities: - Normal muscle tone, strength bilateral upper extremities 5/5, lower extremities 5/5  Lymphadenopathy:     Cervical: No cervical adenopathy.  Skin:    General: Skin is warm and dry.     Findings: No erythema or rash.  Neurological:     Mental Status: She is alert and oriented to person, place, and time.     Comments: Distal sensation intact to light touch all extremities  Psychiatric:  Mood and Affect: Mood normal.        Behavior: Behavior normal.        Thought Content: Thought content normal.     Comments: Well groomed, good eye contact, normal speech  and thoughts     Results for orders placed or performed in visit on 11/08/23  TSH   Collection Time: 11/09/23  8:59 AM  Result Value Ref Range   TSH 1.90 0.40 - 4.50 mIU/L  Lipid panel   Collection Time: 11/09/23  8:59 AM  Result Value Ref Range   Cholesterol 169 <200 mg/dL   HDL 42 (L) > OR = 50 mg/dL   Triglycerides 862 <849 mg/dL   LDL Cholesterol (Calc) 103 (H) mg/dL (calc)   Total CHOL/HDL Ratio 4.0 <5.0 (calc)   Non-HDL Cholesterol (Calc) 127 <130 mg/dL (calc)  Hemoglobin J8r   Collection Time: 11/09/23  8:59 AM  Result Value Ref Range   Hgb A1c MFr Bld 5.3 <5.7 %   Mean Plasma Glucose 105 mg/dL   eAG (mmol/L) 5.8 mmol/L  CBC with Differential/Platelet   Collection Time: 11/09/23  8:59 AM  Result Value Ref Range   WBC 4.8 3.8 - 10.8 Thousand/uL   RBC 3.94 3.80 - 5.10 Million/uL   Hemoglobin 12.0 11.7 - 15.5 g/dL   HCT 63.9 64.9 - 54.9 %   MCV 91.4 80.0 - 100.0 fL   MCH 30.5 27.0 - 33.0 pg   MCHC 33.3 32.0 - 36.0 g/dL   RDW 85.9 88.9 - 84.9 %   Platelets 167 140 - 400 Thousand/uL   MPV 10.0 7.5 - 12.5 fL   Neutro Abs 3,226 1,500 - 7,800 cells/uL   Absolute Lymphocytes 1,190 850 - 3,900 cells/uL   Absolute Monocytes 317 200 - 950 cells/uL   Eosinophils Absolute 48 15 - 500 cells/uL   Basophils Absolute 19 0 - 200 cells/uL   Neutrophils Relative % 67.2 %   Total Lymphocyte 24.8 %   Monocytes Relative 6.6 %   Eosinophils Relative 1.0 %   Basophils Relative 0.4 %  Comprehensive metabolic panel with GFR   Collection Time: 11/09/23  8:59 AM  Result Value Ref Range   Glucose, Bld 86 65 - 99 mg/dL   BUN 14 7 - 25 mg/dL   Creat 9.26 9.39 - 8.99 mg/dL   eGFR 87 > OR = 60 fO/fpw/8.26f7   BUN/Creatinine Ratio SEE NOTE: 6 - 22 (calc)   Sodium 142 135 - 146 mmol/L   Potassium 4.4 3.5 - 5.3 mmol/L   Chloride 106 98 - 110 mmol/L   CO2 31 20 - 32 mmol/L   Calcium  9.2 8.6 - 10.4 mg/dL   Total Protein 6.2 6.1 - 8.1 g/dL   Albumin 4.0 3.6 - 5.1 g/dL   Globulin 2.2 1.9 -  3.7 g/dL (calc)   AG Ratio 1.8 1.0 - 2.5 (calc)   Total Bilirubin 0.8 0.2 - 1.2 mg/dL   Alkaline phosphatase (APISO) 79 37 - 153 U/L   AST 12 10 - 35 U/L   ALT 11 6 - 29 U/L      Assessment & Plan:   Problem List Items Addressed This Visit     Colon polyps   Relevant Orders   Ambulatory referral to Gastroenterology   Dyslipidemia   Other Visit Diagnoses       Annual physical exam    -  Primary     Abnormal glucose         Gastroesophageal reflux disease  without esophagitis         Encounter for screening mammogram for malignant neoplasm of breast       Relevant Orders   MM 3D SCREENING MAMMOGRAM BILATERAL BREAST     Acute cystitis with hematuria       Relevant Medications   amoxicillin -clavulanate (AUGMENTIN ) 875-125 MG tablet   Other Relevant Orders   Urine Culture        Updated Health Maintenance information Reviewed recent lab results with patient Encouraged improvement to lifestyle with diet and exercise Goal of weight loss   Urinary tract infection Recurrent UTI with ineffective Macrobid  treatment suggests possible resistance. - Order urinalysis and urine culture. - Prescribe Augmentin  for 7 days. Note she failed Keflex in past. Failed Macrobid . Side effects on Cipro  - Send prescription to [facility] pharmacy.  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 Discussed lack of definitive treatment and importance of pacing. - Consider referral to [facility] post-COVID clinic if symptoms worsen.  Hyperlipidemia Hyperlipidemia well-controlled. Discussed statin therapy due to 22% ten-year cardiovascular risk, influenced by tobacco use. Considered heart scan for coronary artery assessment. - Order heart scan next year instead of low dose CT.  Tobacco use Tobacco use increases cardiovascular risk. Discussed health impact and cessation benefits.  Gastroesophageal reflux disease GERD managed with omeprazole .  Symptoms controlled, but dietary triggers cause exacerbations. - Continue omeprazole  as prescribed.  Malignant neoplasm of right conjunctiva, in remission, under surveillance Followed by [facility] Neoplasm in remission. Recent MRI shows no disease. Follow-up with radiology oncology reduced to every six months, then annually. - Continue surveillance with MRI in four months.       CT Coronary Calcium  Score NEXT YEAR instead of LDCT   Orders Placed This Encounter  Procedures   Urine Culture   MM 3D SCREENING MAMMOGRAM BILATERAL BREAST    Standing Status:   Future    Expiration Date:   11/14/2024    Reason for Exam (SYMPTOM  OR DIAGNOSIS REQUIRED):   Screening bilateral 3D Mammogram Tomo    Preferred imaging location?:   [facility]   Ambulatory referral to Gastroenterology    Referral Priority:   Routine    Referral Type:   Consultation    Referral Reason:   Specialty Services Required    Number of Visits Requested:   1    Meds ordered this encounter  Medications   amoxicillin -clavulanate (AUGMENTIN ) 875-125 MG tablet    Sig: Take 1 tablet by mouth 2 (two) times daily for 7 days.    Dispense:  14 tablet    Refill:  0     Follow up plan: Return for 1 year fasting lab > 1 week later Annual Physical.   Marsa Officer, DO [facility] 11/15/2023, 2:17 PM

## 2023-11-15 NOTE — Patient Instructions (Addendum)
 Thank you for coming to the office today.  Urine Culture today Augmentin  ordered x 7 days  Recommend daily Multivitamin  You do likely have some chronic fatigue syndrome can be post-viral like post COVID  Future in the Fall 2025, recommend Flu shot + Prevnar-20 Pneumonia shot when ready at the pharmacy  For Mammogram screening for breast cancer   Call the Imaging Center below anytime to schedule your own appointment now that order has been placed.  [facility] [address], [address] [place], [place] [address] Phone: [phone]  ----  Referral to GI [facility] [place] region, stay tuned for apt.  -----  NEXT YEAR we can skip the Lung Scan  Coronary Calcium  Score Cardiac CT Scan. This is a screening test for patients aged 79-50+ with cardiovascular risk factors or who are healthy but would be interested in Cardiovascular Screening for heart disease. Even if there is a family history of heart disease, this imaging can be useful. Typically it can be done every 5+ years or at a different timeline we agree on  The scan will look at the chest and mainly focus on the heart and identify early signs of calcium  build up or blockages within the heart arteries. It is not 100% accurate for identifying blockages or heart disease, but it is useful to help us  predict who may have some early changes or be at risk in the future for a heart attack or cardiovascular problem.  The results are reviewed by a Cardiologist and they will document the results. It should become available on MyChart. Typically the results are divided into percentiles based on other patients of the same demographic and age. So it will compare your risk to others similar to you. If you have a higher score >99 or higher percentile >75%tile, it is recommended to consider Statin cholesterol therapy and or referral to Cardiologist. I will try to help explain  your results and if we have questions we can contact the Cardiologist.  You will be contacted for scheduling. Usually it is done at any imaging facility through [facility], [facility] or [facility].  The cost is $99 flat fee total and it does not go through insurance, so no authorization is required.  DUE for FASTING BLOOD WORK (no food or drink after midnight before the lab appointment, only water or coffee without cream/sugar on the morning of)  SCHEDULE Lab Only visit in the morning at the clinic for lab draw in 1 YEAR  - Make sure Lab Only appointment is at about 1 week before your next appointment, so that results will be available  For Lab Results, once available within 2-3 days of blood draw, you can can log in to MyChart online to view your results and a brief explanation. Also, we can discuss results at next follow-up visit.    Please schedule a Follow-up Appointment to: Return for 1 year fasting lab > 1 week later Annual Physical.  If you have any other questions or concerns, please feel free to call the office or send a message through MyChart. You may also schedule an earlier appointment if necessary.  Additionally, you may be receiving a survey about your experience at our office within a few days to 1 week by e-mail or mail. We value your feedback.  Marsa Officer, DO [facility], [place]

## 2023-11-18 ENCOUNTER — Ambulatory Visit: Payer: Self-pay | Admitting: Family Medicine

## 2023-11-18 DIAGNOSIS — B9620 Unspecified Escherichia coli [E. coli] as the cause of diseases classified elsewhere: Secondary | ICD-10-CM

## 2023-11-18 LAB — URINE CULTURE
MICRO NUMBER:: 16814916
SPECIMEN QUALITY:: ADEQUATE

## 2023-11-22 ENCOUNTER — Ambulatory Visit
Admission: RE | Admit: 2023-11-22 | Discharge: 2023-11-22 | Disposition: A | Discharge status: Home / Self Care | Source: Ambulatory Visit | Attending: Family Medicine | Admitting: Family Medicine

## 2023-11-22 DIAGNOSIS — Z1231 Encounter for screening mammogram for malignant neoplasm of breast: Secondary | ICD-10-CM | POA: Diagnosis not present

## 2023-11-26 ENCOUNTER — Other Ambulatory Visit

## 2023-11-26 DIAGNOSIS — B9620 Unspecified Escherichia coli [E. coli] as the cause of diseases classified elsewhere: Secondary | ICD-10-CM

## 2023-11-26 DIAGNOSIS — N390 Urinary tract infection, site not specified: Secondary | ICD-10-CM | POA: Diagnosis not present

## 2023-11-27 LAB — URINALYSIS, ROUTINE W REFLEX MICROSCOPIC
Bilirubin Urine: NEGATIVE
Glucose, UA: NEGATIVE
Hgb urine dipstick: NEGATIVE
Hyaline Cast: NONE SEEN /LPF
Ketones, ur: NEGATIVE
Nitrite: NEGATIVE
Protein, ur: NEGATIVE
RBC / HPF: NONE SEEN /HPF (ref 0–2)
Specific Gravity, Urine: 1.018 (ref 1.001–1.035)
pH: 5.5 (ref 5.0–8.0)

## 2023-11-27 LAB — URINE CULTURE
MICRO NUMBER:: 16871585
Result:: NO GROWTH
SPECIMEN QUALITY:: ADEQUATE

## 2023-11-27 LAB — MICROSCOPIC MESSAGE

## 2023-11-30 ENCOUNTER — Encounter: Payer: Self-pay | Admitting: Family Medicine

## 2023-12-10 ENCOUNTER — Other Ambulatory Visit (HOSPITAL_COMMUNITY): Payer: Self-pay

## 2023-12-12 DIAGNOSIS — M1712 Unilateral primary osteoarthritis, left knee: Secondary | ICD-10-CM | POA: Diagnosis not present

## 2023-12-12 DIAGNOSIS — M25462 Effusion, left knee: Secondary | ICD-10-CM | POA: Diagnosis not present

## 2024-01-13 DIAGNOSIS — J0100 Acute maxillary sinusitis, unspecified: Secondary | ICD-10-CM | POA: Diagnosis not present

## 2024-01-13 DIAGNOSIS — Z1159 Encounter for screening for other viral diseases: Secondary | ICD-10-CM | POA: Diagnosis not present

## 2024-02-04 ENCOUNTER — Ambulatory Visit: Payer: Self-pay

## 2024-02-04 DIAGNOSIS — Z0000 Encounter for general adult medical examination without abnormal findings: Secondary | ICD-10-CM | POA: Diagnosis not present

## 2024-02-04 NOTE — Patient Instructions (Addendum)
 Julie Pena,  Thank you for taking the time for your Medicare Wellness Visit. I appreciate your continued commitment to your health goals. Please review the care plan we discussed, and feel free to reach out if I can assist you further.  Medicare recommends these wellness visits once per year to help you and your care team stay ahead of potential health issues. These visits are designed to focus on prevention, allowing your provider to concentrate on managing your acute and chronic conditions during your regular appointments.  Please note that Annual Wellness Visits do not include a physical exam. Some assessments may be limited, especially if the visit was conducted virtually. If needed, we may recommend a separate in-person follow-up with your provider.  Ongoing Care Seeing your primary care provider every 3 to 6 months helps us  monitor your health and provide consistent, personalized care.   Referrals If a referral was made during today's visit and you haven't received any updates within two weeks, please contact the referred provider directly to check on the status.  Recommended Screenings:  Health Maintenance  Topic Date Due   COVID-19 Vaccine (1) Never done   Pneumococcal Vaccine for age over 74 (2 of 2 - PCV) 07/31/2021   Colon Cancer Screening  09/19/2023   Flu Shot  11/05/2023   Zoster (Shingles) Vaccine (1 of 2) 02/15/2024*   Screening for Lung Cancer  09/26/2024   Breast Cancer Screening  11/21/2024   Medicare Annual Wellness Visit  02/03/2025   DEXA scan (bone density measurement)  08/01/2025   DTaP/Tdap/Td vaccine (2 - Td or Tdap) 08/26/2027   Hepatitis C Screening  Completed   Meningitis B Vaccine  Aged Out   Cologuard (Stool DNA test)  Discontinued  *Topic was postponed. The date shown is not the original due date.    Advance Care Planning is important because it: Ensures you receive medical care that aligns with your values, goals, and preferences. Provides  guidance to your family and loved ones, reducing the emotional burden of decision-making during critical moments.  Vision: Annual vision screenings are recommended for early detection of glaucoma, cataracts, and diabetic retinopathy. These exams can also reveal signs of chronic conditions such as diabetes and high blood pressure.  Dental: Annual dental screenings help detect early signs of oral cancer, gum disease, and other conditions linked to overall health, including heart disease and diabetes.  Please see the attached documents for additional preventive care recommendations.   NEXT AWV 02/09/25 @ 11:30 AM BY VIDEO

## 2024-02-04 NOTE — Progress Notes (Signed)
 Subjective:   Julie Pena is a 73 y.o. who presents for a Medicare Wellness preventive visit.  As a reminder, Annual Wellness Visits don't include a physical exam, and some assessments may be limited, especially if this visit is performed virtually. We may recommend an in-person follow-up visit with your provider if needed.  Visit Complete: Virtual I connected with  Julie Pena on 02/04/24 by a video and audio enabled telemedicine application and verified that I am speaking with the correct person using two identifiers.  Patient Location: Home  Provider Location: Office/Clinic  I discussed the limitations of evaluation and management by telemedicine. The patient expressed understanding and agreed to proceed.  Vital Signs: Because this visit was a virtual/telehealth visit, some criteria may be missing or patient reported. Any vitals not documented were not able to be obtained and vitals that have been documented are patient reported.  VideoError- Librarian, academic were attempted between this provider and patient, however failed, due to patient having technical difficulties OR patient did not have access to video capability.  We continued and completed visit with audio only.   Persons Participating in Visit: Patient.  AWV Questionnaire: No: Patient Medicare AWV questionnaire was not completed prior to this visit.  Cardiac Risk Factors include: advanced age (>39men, >93 women);dyslipidemia;smoking/ tobacco exposure     Objective:    There were no vitals filed for this visit. There is no height or weight on file to calculate BMI.     02/04/2024    1:28 PM 01/29/2023   11:03 AM 10/18/2022    1:16 PM 01/23/2022   11:50 AM 01/14/2021    2:11 PM 09/18/2020    7:40 AM 07/11/2019    3:30 PM  Advanced Directives  Does Patient Have a Medical Advance Directive? No No No No Yes Yes Yes  Type of Agricultural Consultant;Living will  Healthcare Power of [place];Living will Living will;Healthcare Power of Attorney  Copy of Healthcare Power of Attorney in Chart?     No - copy requested No - copy requested No - copy requested  Would patient like information on creating a medical advance directive? No - Patient declined No - Patient declined No - Patient declined No - Patient declined       Current Medications (verified) Outpatient Encounter Medications as of 02/04/2024  Medication Sig   omeprazole  (PRILOSEC) 40 MG capsule Take 1 capsule (40 mg total) by mouth daily before breakfast.   Probiotic Product (ALIGN PO) Take by mouth. Take probiotic daily   No facility-administered encounter medications on file as of 02/04/2024.    Allergies (verified) Codeine   History: Past Medical History:  Diagnosis Date   Diverticulosis    Past Surgical History:  Procedure Laterality Date   ABDOMINAL HYSTERECTOMY  1988   Initial partial 1978, then repeat TOTAL 1988   BREAST EXCISIONAL BIOPSY Right mid 1980s   benign   CATARACT EXTRACTION Bilateral 2018   COLONOSCOPY WITH PROPOFOL  N/A 09/18/2020   Procedure: COLONOSCOPY WITH PROPOFOL ;  Surgeon: Unk Corinn Skiff, MD;  Location: [facility] ENDOSCOPY;  Service: Gastroenterology;  Laterality: N/A;   DENTAL SURGERY  04/2019   OOPHORECTOMY     TONSILLECTOMY Bilateral 1963   Family History  Problem Relation Age of Onset   Cancer Mother        kidney   Kidney cancer Mother    Cancer Father        lung  Alcohol abuse Father    Depression Father    Lung cancer Father    Colon cancer Neg Hx    Breast cancer Neg Hx    Social History   Socioeconomic History   Marital status: Married    Spouse name: Julie Pena   Number of children: Not on file   Years of education: Not on file   Highest education level: 12th grade  Occupational History   Occupation: retired  Tobacco Use   Smoking status: Every Day    Current packs/day: 0.50    Average packs/day: 0.5 packs/day for 47.0  years (23.5 ttl pk-yrs)    Types: Cigarettes   Smokeless tobacco: Current   Tobacco comments:    enrolled in smoking cessation class starting 08/2019  Vaping Use   Vaping status: Never Used  Substance and Sexual Activity   Alcohol use: Never   Drug use: Never   Sexual activity: Yes  Other Topics Concern   Not on file  Social History Narrative   Not on file   Social Drivers of Health   Financial Resource Strain: Low Risk  (02/04/2024)   Overall Financial Resource Strain (CARDIA)    Difficulty of Paying Living Expenses: Not hard at all  Food Insecurity: No Food Insecurity (02/04/2024)   Hunger Vital Sign    Worried About Running Out of Food in the Last Year: Never true    Ran Out of Food in the Last Year: Never true  Transportation Needs: No Transportation Needs (02/04/2024)   PRAPARE - Administrator, Civil Service (Medical): No    Lack of Transportation (Non-Medical): No  Physical Activity: Insufficiently Active (02/04/2024)   Exercise Vital Sign    Days of Exercise per Week: 3 days    Minutes of Exercise per Session: 30 min  Stress: No Stress Concern Present (02/04/2024)   [employer] of Occupational Health - Occupational Stress Questionnaire    Feeling of Stress: Not at all  Social Connections: Socially Integrated (02/04/2024)   Social Connection and Isolation Panel    Frequency of Communication with Friends and Family: More than three times a week    Frequency of Social Gatherings with Friends and Family: Three times a week    Attends Religious Services: More than 4 times per year    Active Member of Clubs or Organizations: Yes    Attends Banker Meetings: More than 4 times per year    Marital Status: Married    Tobacco Counseling Ready to quit: Not Answered Counseling given: Not Answered Tobacco comments: enrolled in smoking cessation class starting 08/2019    Clinical Intake:  Pre-visit preparation completed: Yes  Pain :  No/denies pain     BMI - recorded: 28.4 Nutritional Status: BMI 25 -29 Overweight Nutritional Risks: None Diabetes: No  Lab Results  Component Value Date   HGBA1C 5.3 11/09/2023   HGBA1C 5.3 07/21/2021   HGBA1C 5.2 11/03/2018     How often do you need to have someone help you when you read instructions, pamphlets, or other written materials from your doctor or pharmacy?: 1 - Never  Interpreter Needed?: No  Information entered by :: JHONNIE DAS, LPN   Activities of Daily Living     02/04/2024    1:30 PM  In your present state of health, do you have any difficulty performing the following activities:  Hearing? 0  Vision? 0  Difficulty concentrating or making decisions? 0  Walking or climbing stairs? 0  Dressing or bathing? 0  Doing errands, shopping? 0  Preparing Food and eating ? N  Using the Toilet? N  In the past six months, have you accidently leaked urine? N  Do you have problems with loss of bowel control? N  Managing your Medications? N  Managing your Finances? N  Housekeeping or managing your Housekeeping? N    Patient Care Team: Edman Marsa PARAS, DO as PCP - General (Family Medicine) Mevelyn JONETTA Bathe, OD (Optometry)  I have updated your Care Teams any recent Medical Services you may have received from other providers in the past year.     Assessment:   This is a routine wellness examination for Julie Pena.  Hearing/Vision screen Hearing Screening - Comments:: NO AIDS Vision Screening - Comments:: READERS- [place]- SEEN ONCE PER YEAR   Goals Addressed             This Visit's Progress    Cut out extra servings         Depression Screen     02/04/2024    1:26 PM 01/29/2023   11:01 AM 01/18/2023    1:20 PM 01/23/2022   11:49 AM 01/22/2021    1:38 PM 01/14/2021    2:12 PM 07/11/2019    3:25 PM  PHQ 2/9 Scores  PHQ - 2 Score 0 0 0 0 0 0 0  PHQ- 9 Score 0 0  0 3      Fall Risk     02/04/2024    1:29 PM 01/29/2023   11:04 AM  01/23/2022   11:51 AM 01/22/2021    1:38 PM 01/14/2021    2:12 PM  Fall Risk   Falls in the past year? 0 0 0 0 0  Number falls in past yr: 0 0 0 0   Injury with Fall? 0 0 0 0   Risk for fall due to : No Fall Risks No Fall Risks No Fall Risks  No Fall Risks  Follow up Falls evaluation completed;Falls prevention discussed Falls prevention discussed;Falls evaluation completed Falls prevention discussed;Falls evaluation completed  Falls evaluation completed  Falls evaluation completed;Education provided;Falls prevention discussed      Data saved with a previous flowsheet row definition    MEDICARE RISK AT HOME:  Medicare Risk at Home Any stairs in or around the home?: Yes If so, are there any without handrails?: No Home free of loose throw rugs in walkways, pet beds, electrical cords, etc?: Yes Adequate lighting in your home to reduce risk of falls?: Yes Life alert?: No Use of a cane, walker or w/c?: No Grab bars in the bathroom?: Yes Shower chair or bench in shower?: No Elevated toilet seat or a handicapped toilet?: Yes  TIMED UP AND GO:  Was the test performed?  No  Cognitive Function: 6CIT completed        02/04/2024    1:35 PM 01/29/2023   11:06 AM 01/23/2022   11:51 AM 01/14/2021    2:13 PM  6CIT Screen  What Year? 0 points 0 points 0 points 0 points  What month? 0 points 0 points 0 points 0 points  What time? 0 points 0 points 0 points 0 points  Count back from 20 0 points 0 points 0 points 0 points  Months in reverse 0 points 0 points 0 points 0 points  Repeat phrase 0 points 0 points 0 points 0 points  Total Score 0 points 0 points 0 points 0 points    Immunizations Immunization History  Administered Date(s) Administered   Fluad Quad(high Dose 65+) 01/19/2019   INFLUENZA, HIGH DOSE SEASONAL PF 01/22/2017   Influenza-Unspecified 02/05/2018, 01/10/2020   Pneumococcal Polysaccharide-23 07/31/2020   Tdap 08/25/2017    Screening Tests Health Maintenance   Topic Date Due   COVID-19 Vaccine (1) Never done   Pneumococcal Vaccine: 50+ Years (2 of 2 - PCV) 07/31/2021   Colonoscopy  09/19/2023   Influenza Vaccine  11/05/2023   Zoster Vaccines- Shingrix (1 of 2) 02/15/2024 (Originally 09/01/1969)   Lung Cancer Screening  09/26/2024   Mammogram  11/21/2024   Medicare Annual Wellness (AWV)  02/03/2025   DEXA SCAN  08/01/2025   DTaP/Tdap/Td (2 - Td or Tdap) 08/26/2027   Hepatitis C Screening  Completed   Meningococcal B Vaccine  Aged Out   Fecal DNA (Cologuard)  Discontinued    Health Maintenance Items Addressed: NEEDS FLU, SHINGRIX; UTD ON LUNG CA SCREENING & MAMMOGRAM & BDS; MAKING APPT FOR COLONOSCOPY;   Additional Screening:  Vision Screening: Recommended annual ophthalmology exams for early detection of glaucoma and other disorders of the eye. Is the patient up to date with their annual eye exam?  Yes  Who is the provider or what is the name of the office in which the patient attends annual eye exams? [facility]  Dental Screening: Recommended annual dental exams for proper oral hygiene  Community Resource Referral / Chronic Care Management: CRR required this visit?  No   CCM required this visit?  No   Plan:    I have personally reviewed and noted the following in the patient's chart:   Medical and social history Use of alcohol, tobacco or illicit drugs  Current medications and supplements including opioid prescriptions. Patient is not currently taking opioid prescriptions. Functional ability and status Nutritional status Physical activity Advanced directives List of other physicians Hospitalizations, surgeries, and ER visits in previous 12 months Vitals Screenings to include cognitive, depression, and falls Referrals and appointments  In addition, I have reviewed and discussed with patient certain preventive protocols, quality metrics, and best practice recommendations. A written personalized care plan for preventive  services as well as general preventive health recommendations were provided to patient.   Jhonnie GORMAN Das, LPN   [id number]   After Visit Summary: (MyChart) Due to this being a telephonic visit, the after visit summary with patients personalized plan was offered to patient via MyChart   Notes: Nothing significant to report at this time.

## 2024-02-07 ENCOUNTER — Other Ambulatory Visit (HOSPITAL_COMMUNITY): Payer: Self-pay

## 2024-03-20 ENCOUNTER — Other Ambulatory Visit (HOSPITAL_COMMUNITY): Payer: Self-pay

## 2024-04-03 ENCOUNTER — Telehealth: Payer: Self-pay

## 2024-04-03 ENCOUNTER — Encounter: Payer: Self-pay | Admitting: Family Medicine

## 2024-04-03 NOTE — Telephone Encounter (Signed)
 Spoke with patient, she does not want to go out in public and get a COVID test. She feels she just has sinus congestion. She will wait on having a test done.

## 2024-04-05 ENCOUNTER — Ambulatory Visit: Admitting: Family Medicine

## 2024-04-18 ENCOUNTER — Encounter: Payer: Self-pay | Admitting: Family Medicine

## 2024-04-19 ENCOUNTER — Ambulatory Visit: Admitting: Family Medicine

## 2024-04-19 ENCOUNTER — Encounter: Payer: Self-pay | Admitting: Family Medicine

## 2024-04-19 VITALS — BP 136/74 | HR 87 | Ht 66.0 in | Wt 177.0 lb

## 2024-04-19 DIAGNOSIS — N3001 Acute cystitis with hematuria: Secondary | ICD-10-CM | POA: Diagnosis not present

## 2024-04-19 DIAGNOSIS — R300 Dysuria: Secondary | ICD-10-CM | POA: Diagnosis not present

## 2024-04-19 LAB — POCT URINE DIPSTICK
Bilirubin, UA: NEGATIVE
Glucose, UA: NEGATIVE mg/dL
Ketones, POC UA: NEGATIVE mg/dL
Nitrite, UA: NEGATIVE
POC PROTEIN,UA: NEGATIVE
Spec Grav, UA: 1.01
Urobilinogen, UA: 0.2 U/dL
pH, UA: 5

## 2024-04-19 MED ORDER — AMOXICILLIN-POT CLAVULANATE 875-125 MG PO TABS: 1.0000 | ORAL_TABLET | Freq: Two times a day (BID) | ORAL | 0 refills | Status: AC

## 2024-04-19 NOTE — Progress Notes (Signed)
 "  Subjective:    Patient ID: Julie Pena, female    DOB: May 11, 1950, 74 y.o.   MRN: [id number]  Julie Pena is a 73 y.o. female presenting on 04/19/2024 for Urinary Tract Infection  Patient presents for a same day appointment.  HPI  Discussed the use of AI scribe software for clinical note transcription with the patient, who gave verbal consent to proceed.  History of Present Illness   Julie Pena is a 74 year old female who presents with ongoing urinary symptoms.  Urinary tract symptoms - Ongoing intermittent urinary urgency with passage of only small amounts of urine - Associated bladder spasms - Second episode of UTI; previous episode treated successfully with Augmentin  - History of recurrent UTIs treated with various antibiotics including Cipro  and Augmentin  - For diverticulitis in the past reports - Adverse effects with Cipro  and Flagyl  combination, described as 'toxic' - She Avoids AZO  Recent infectious illnesses and treatments - Recent sinus infection and UTI treated with cefdinir 300 mg twice daily for five days at [facility], negative for strep covid flu rsv and had CXR - Urine culture confirmed E. coli infection - Sinus infection resolved with cefdinir; urinary symptoms persisted after treatment - Five-day course of prednisone , one pill per day, with no significant effect on symptoms  Current medications and supplements - Started taking Centrum over 50 women's vitamins  Constitutional and respiratory symptoms - No current respiratory symptoms - Sinus infection has resolved         02/04/2024    1:26 PM 01/29/2023   11:01 AM 01/18/2023    1:20 PM  Depression screen PHQ 2/9  Decreased Interest 0 0 0  Down, Depressed, Hopeless 0 0 0  PHQ - 2 Score 0 0 0  Altered sleeping 0 0   Tired, decreased energy 0 0   Change in appetite 0 0   Feeling bad or failure about yourself  0 0   Trouble concentrating 0 0   Moving slowly or fidgety/restless 0 0    Suicidal thoughts 0 0   PHQ-9 Score 0  0    Difficult doing work/chores Not difficult at all Not difficult at all      Data saved with a previous flowsheet row definition       01/22/2021    1:39 PM  GAD 7 : Generalized Anxiety Score  Nervous, Anxious, on Edge 0  Control/stop worrying 0  Worry too much - different things 0  Trouble relaxing 0  Restless 0  Easily annoyed or irritable 0  Afraid - awful might happen 0  Total GAD 7 Score 0  Anxiety Difficulty Not difficult at all    Social History[1]  Review of Systems Per HPI unless specifically indicated above     Objective:    BP 136/74 (BP Location: Left Arm, Cuff Size: Normal)   Pulse 87   Ht 5' 6 (1.676 m)   Wt 177 lb (80.3 kg)   SpO2 96%   BMI 28.57 kg/m   Wt Readings from Last 3 Encounters:  04/19/24 177 lb (80.3 kg)  11/15/23 176 lb 6 oz (80 kg)  06/01/23 171 lb (77.6 kg)    Physical Exam Vitals and nursing note reviewed.  Constitutional:      General: She is not in acute distress.    Appearance: Normal appearance. She is well-developed. She is not diaphoretic.     Comments: Well-appearing, comfortable, cooperative  HENT:     Head:  Normocephalic and atraumatic.  Eyes:     General:        Right eye: No discharge.        Left eye: No discharge.     Conjunctiva/sclera: Conjunctivae normal.  Cardiovascular:     Rate and Rhythm: Normal rate.  Pulmonary:     Effort: Pulmonary effort is normal.  Skin:    General: Skin is warm and dry.     Findings: No erythema or rash.  Neurological:     Mental Status: She is alert and oriented to person, place, and time.  Psychiatric:        Mood and Affect: Mood normal.        Behavior: Behavior normal.        Thought Content: Thought content normal.     Comments: Well groomed, good eye contact, normal speech and thoughts     Results for orders placed or performed in visit on 04/19/24  POCT URINE DIPSTICK   Collection Time: 04/19/24  4:14 PM  Result Value  Ref Range   Color, UA light yellow (A) yellow   Clarity, UA clear clear   Glucose, UA negative negative mg/dL   Bilirubin, UA negative negative   Ketones, POC UA negative negative mg/dL   Spec Grav, UA 8.989 8.989 - 1.025   Blood, UA small (A) negative   pH, UA 5.0 5.0 - 8.0   POC PROTEIN,UA negative negative, trace   Urobilinogen, UA 0.2 0.2 or 1.0 E.U./dL   Nitrite, UA Negative Negative   Leukocytes, UA Trace (A) Negative      Assessment & Plan:   Problem List Items Addressed This Visit   None Visit Diagnoses       Acute cystitis with hematuria    -  Primary   Relevant Medications   amoxicillin -clavulanate (AUGMENTIN ) 875-125 MG tablet     Dysuria       Relevant Orders   POCT URINE DIPSTICK (Completed)   Urine Culture        Acute cystitis with hematuria History of UTI in past. Has failed Macrobid  and Keflex before. Persistent symptoms despite cefdinir short course x 5 days from urgent care. Recent Urine culture confirmed E. coli. Augmentin  chosen for efficacy and safety, no resistance noted.  - Prescribed Augmentin  875 mg twice daily for 7 days for extended therapy - Recommended daily cranberry and D-mannose supplements. For prophylaxis - Monitor effectiveness in reducing UTI frequency.        Orders Placed This Encounter  Procedures   Urine Culture   POCT URINE DIPSTICK    Meds ordered this encounter  Medications   amoxicillin -clavulanate (AUGMENTIN ) 875-125 MG tablet    Sig: Take 1 tablet by mouth 2 (two) times daily. For 7 days    Dispense:  14 tablet    Refill:  0    Follow up plan: Return if symptoms worsen or fail to improve.  Marsa Officer, DO [facility] 04/19/2024, 4:51 PM     [1]  Social History Tobacco Use   Smoking status: Every Day    Current packs/day: 0.50    Average packs/day: 0.5 packs/day for 47.0 years (23.5 ttl pk-yrs)    Types: Cigarettes   Smokeless tobacco: Current    Tobacco comments:    enrolled in smoking cessation class starting 08/2019  Vaping Use   Vaping status: Never Used  Substance Use Topics   Alcohol use: Never   Drug use: Never   "

## 2024-04-19 NOTE — Patient Instructions (Addendum)
 Thank you for coming to the office today.  1. You have a Urinary Tract Infection - this is very common, your symptoms are reassuring and you should get better within 1 week on the antibiotics - Start Augmentin  2 times daily for next 7 days, complete entire course, even if feeling better - We sent urine for a culture, we will call you within next few days if we need to change antibiotics - Please drink plenty of fluids, improve hydration over next 1 week  If symptoms worsening, developing nausea / vomiting, worsening back pain, fevers / chills / sweats, then please return for re-evaluation sooner.  If you take AZO OTC - limit this to 2-3 days MAX to avoid affecting kidneys  Cranberry + D-Mannose is a natural supplement that can actually help bind to urinary bacteria and reduce their effectiveness it can help prevent UTI from forming, and may reduce some symptoms. It likely cannot cure an active UTI but it is worth a try and good to prevent them with. Try 500mg  twice a day at a full dose if you want, or check package instructions for more info   Please schedule a Follow-up Appointment to: Return if symptoms worsen or fail to improve.  If you have any other questions or concerns, please feel free to call the office or send a message through MyChart. You may also schedule an earlier appointment if necessary.  Additionally, you may be receiving a survey about your experience at our office within a few days to 1 week by e-mail or mail. We value your feedback.  Marsa Officer, DO [facility], [place]

## 2024-04-22 LAB — URINE CULTURE
MICRO NUMBER:: 17468351
SPECIMEN QUALITY:: ADEQUATE

## 2024-04-24 ENCOUNTER — Ambulatory Visit: Payer: Self-pay | Admitting: Family Medicine

## 2025-02-09 ENCOUNTER — Ambulatory Visit
